# Patient Record
Sex: Male | Born: 2000 | Race: Black or African American | Hispanic: No | Marital: Single | Smoking: Never smoker
Health system: Southern US, Community
[De-identification: ages and names within clinical notes are randomized; demographics above are authoritative.]

## PROBLEM LIST (undated history)

## (undated) DIAGNOSIS — J45909 Unspecified asthma, uncomplicated: Secondary | ICD-10-CM

---
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
## 2020-04-29 IMAGING — CR DG CHEST 2V
2 series · 2 of 2 positions shown · non-contrast
Comparison: Chest x-ray 03/11/2018.

CLINICAL DATA: Left-sided chest pain.  Shortness of breath.

EXAM:
CHEST - 2 VIEW

[chest pa]
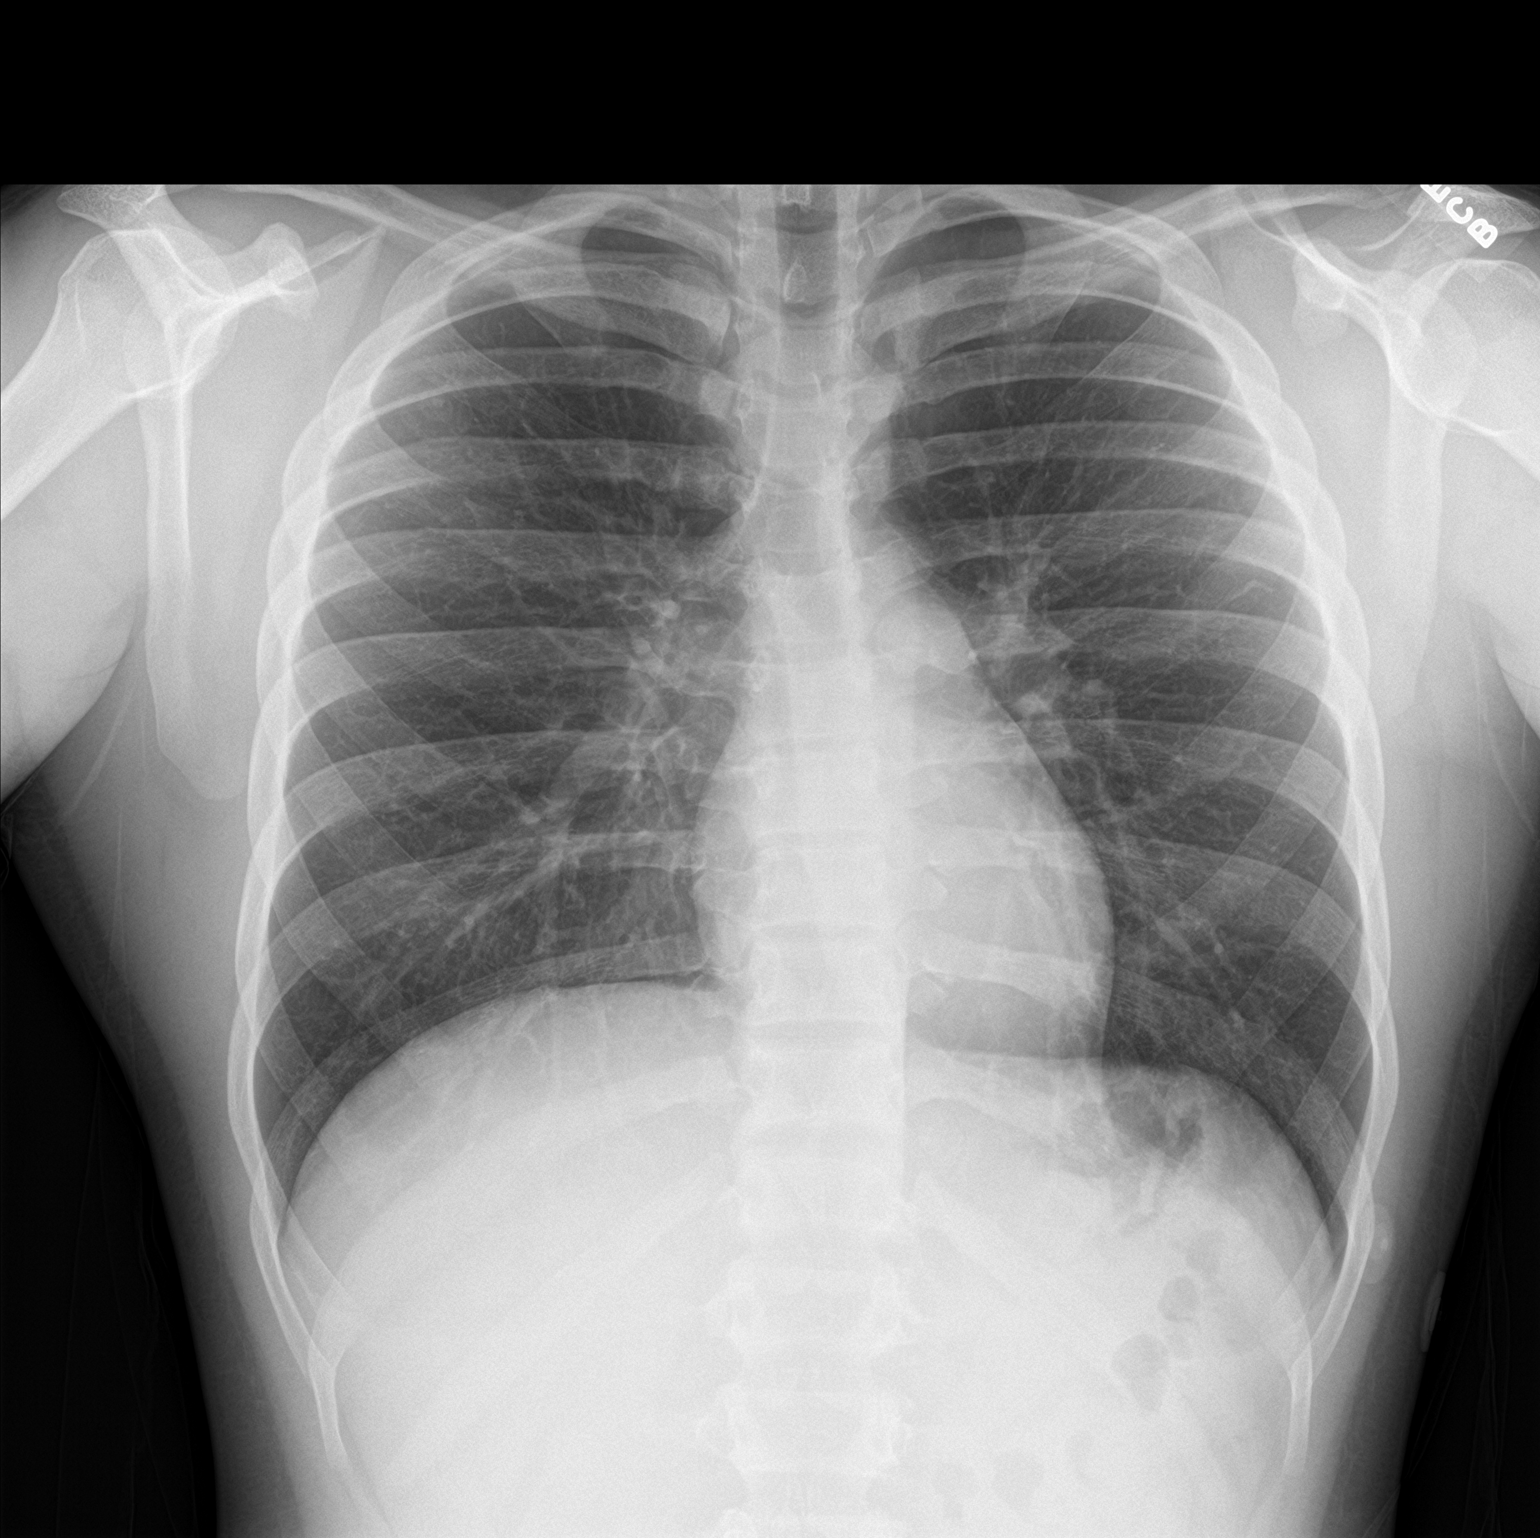

[chest lat]
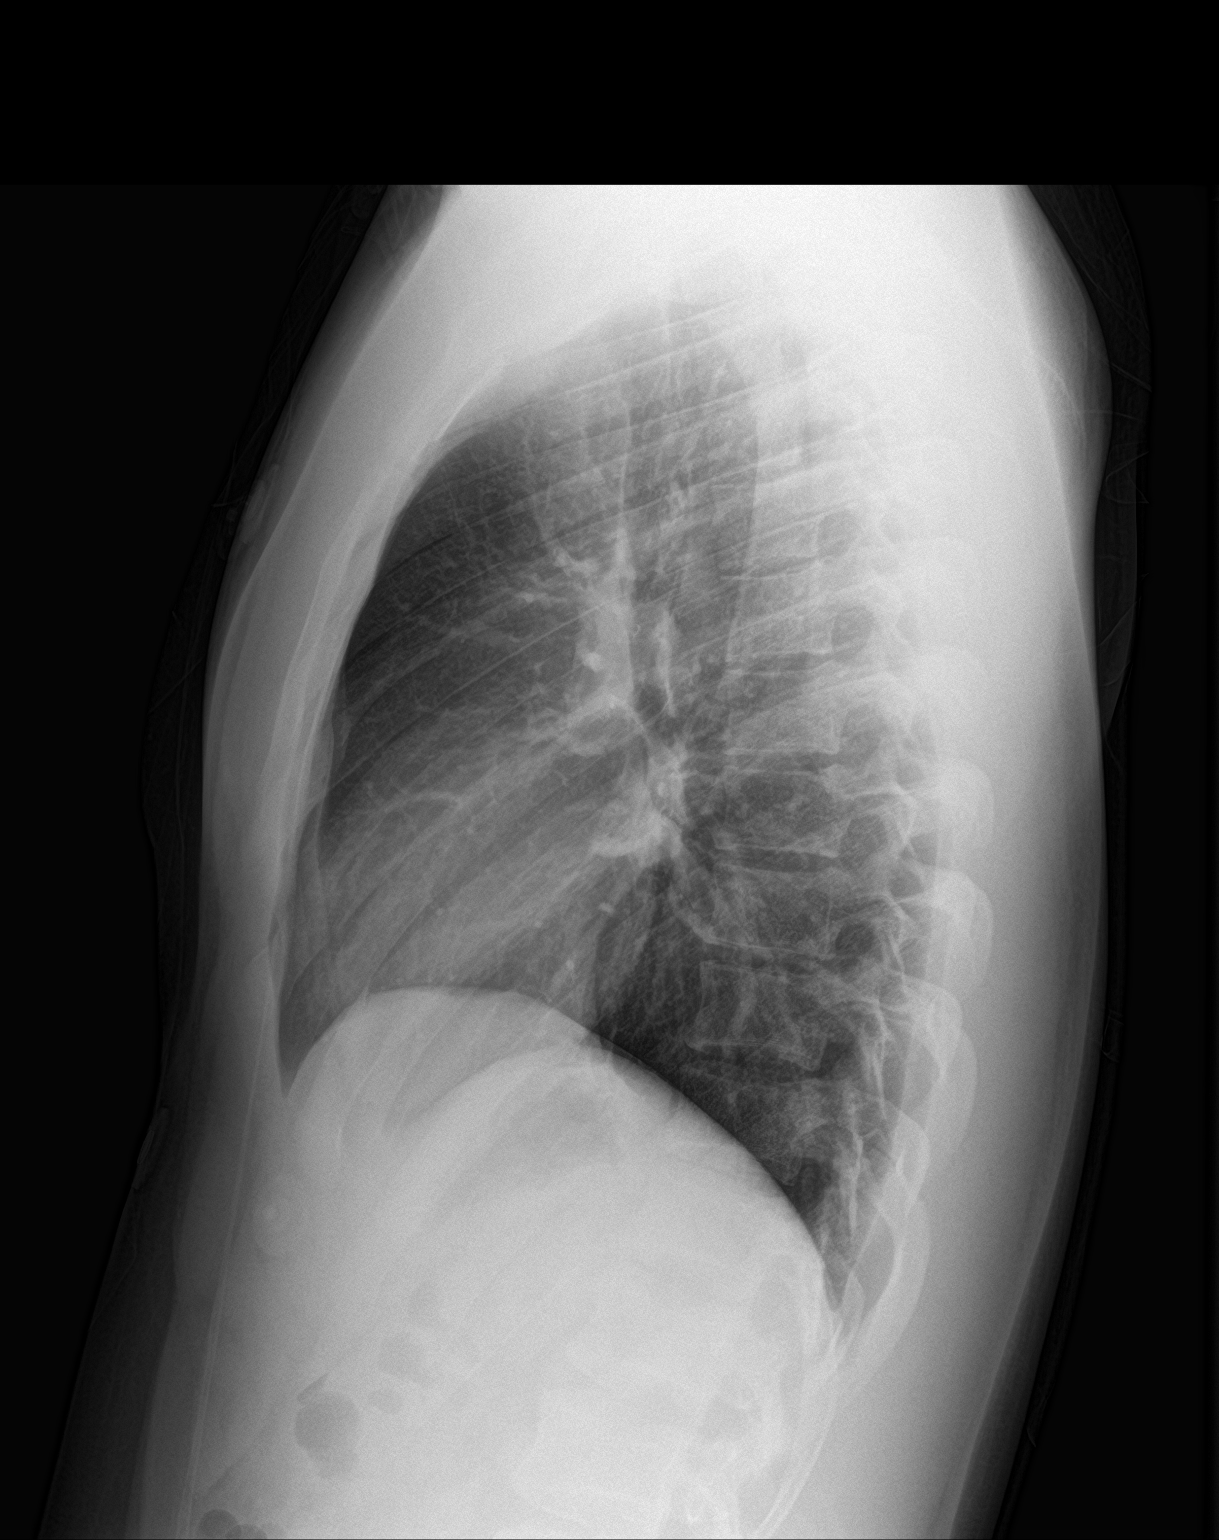

[2 of 2 positions shown; findings below may reference images not displayed]

FINDINGS: Mediastinum and hilar structures normal. Lungs are clear. No pleural
effusion or pneumothorax. Chest is stable from prior exam. No acute
bony abnormality.
IMPRESSION: No acute cardiopulmonary disease.

## 2020-06-12 ENCOUNTER — Emergency Department (HOSPITAL_COMMUNITY)
Admission: EM | Admit: 2020-06-12 | Discharge: 2020-06-12 | Disposition: A | Discharge status: Home / Self Care | Payer: No Typology Code available for payment source | Attending: Emergency Medicine | Admitting: Emergency Medicine

## 2020-06-12 ENCOUNTER — Emergency Department (HOSPITAL_COMMUNITY): Payer: No Typology Code available for payment source

## 2020-06-12 ENCOUNTER — Other Ambulatory Visit: Payer: Self-pay

## 2020-06-12 ENCOUNTER — Encounter (HOSPITAL_COMMUNITY): Payer: Self-pay | Admitting: Emergency Medicine

## 2020-06-12 DIAGNOSIS — Z7951 Long term (current) use of inhaled steroids: Secondary | ICD-10-CM | POA: Insufficient documentation

## 2020-06-12 DIAGNOSIS — M25562 Pain in left knee: Secondary | ICD-10-CM | POA: Diagnosis not present

## 2020-06-12 DIAGNOSIS — M546 Pain in thoracic spine: Secondary | ICD-10-CM | POA: Diagnosis not present

## 2020-06-12 DIAGNOSIS — R519 Headache, unspecified: Secondary | ICD-10-CM | POA: Insufficient documentation

## 2020-06-12 DIAGNOSIS — M542 Cervicalgia: Secondary | ICD-10-CM | POA: Diagnosis not present

## 2020-06-12 DIAGNOSIS — J45909 Unspecified asthma, uncomplicated: Secondary | ICD-10-CM | POA: Insufficient documentation

## 2020-06-12 DIAGNOSIS — S3992XA Unspecified injury of lower back, initial encounter: Secondary | ICD-10-CM

## 2020-06-12 DIAGNOSIS — M25561 Pain in right knee: Secondary | ICD-10-CM | POA: Diagnosis not present

## 2020-06-12 DIAGNOSIS — Y92410 Unspecified street and highway as the place of occurrence of the external cause: Secondary | ICD-10-CM | POA: Diagnosis not present

## 2020-06-12 DIAGNOSIS — S161XXA Strain of muscle, fascia and tendon at neck level, initial encounter: Secondary | ICD-10-CM

## 2020-06-12 MED ORDER — OXYCODONE-ACETAMINOPHEN 5-325 MG PO TABS
1.0000 | ORAL_TABLET | Freq: Once | ORAL | Status: AC
  Administered 2020-06-12: 1 via ORAL
  Filled 2020-06-12: qty 1

## 2020-06-12 NOTE — Discharge Instructions (Addendum)
You will likely be sore for a few days.  Use ice on the sore spots 3-4 times a day, after that use heat.  For pain use Motrin or Tylenol.
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
## 2020-12-01 ENCOUNTER — Ambulatory Visit
Admission: EM | Admit: 2020-12-01 | Discharge: 2020-12-01 | Disposition: A | Discharge status: Home / Self Care | Payer: BC Managed Care – PPO | Attending: Emergency Medicine | Admitting: Emergency Medicine

## 2020-12-01 ENCOUNTER — Other Ambulatory Visit: Payer: Self-pay

## 2020-12-01 DIAGNOSIS — S76011A Strain of muscle, fascia and tendon of right hip, initial encounter: Secondary | ICD-10-CM

## 2020-12-01 DIAGNOSIS — S79911A Unspecified injury of right hip, initial encounter: Secondary | ICD-10-CM

## 2020-12-01 MED ORDER — IBUPROFEN 800 MG PO TABS: 800.0000 mg | ORAL_TABLET | Freq: Three times a day (TID) | ORAL | 0 refills | Status: DC

## 2020-12-01 NOTE — ED Triage Notes (Signed)
Pt presents with ongoing right sided pelvic pain x 2 days since playing basket ball yesterday. Pt reports self medicating with marijuana at home. Ambulatory. Reports hering a pop in his pelvis yesterday while playing.

## 2020-12-01 NOTE — ED Provider Notes (Signed)
[facility]    CSN: [id number] Arrival date & time: 12/01/20  1238      History   Chief Complaint Chief Complaint  Patient presents with   Pelvic Pain    HPI Logan Tate is a 20 y.o. male presenting today for evaluation of hip pain.  Patient reports that yesterday he was playing basketball, planted and pivoted and felt a popping sensation in his right hip.  Since he has had increased pain, sensation of weakness in his leg as well as pain with most movements requiring hip flexion.  Denies history of hip problems.  Using marijuana to help with pain.  HPI  Past Medical History:  Diagnosis Date   Asthma    as a child    There are no problems to display for this patient.   History reviewed. No pertinent surgical history.     Home Medications    Prior to Admission medications   Medication Sig Start Date End Date Taking? Authorizing Provider  ibuprofen (ADVIL) 800 MG tablet Take 1 tablet (800 mg total) by mouth 3 (three) times daily. 12/01/20  Yes Kanaan Kagawa, Junius Creamer, PA-C    Family History Family History  Problem Relation Age of Onset   High Cholesterol Mother    Diabetes Mother    Hyperlipidemia Father     Social History Social History   Tobacco Use   Smoking status: Never   Smokeless tobacco: Never  Vaping Use   Vaping Use: Never used  Substance Use Topics   Alcohol use: Yes    Comment: 1-2 x a mth.     Drug use: Yes    Types: Marijuana    Comment: 1-2 x a wk     Allergies   Patient has no known allergies.   Review of Systems Review of Systems  Constitutional:  Negative for fatigue and fever.  Eyes:  Negative for redness, itching and visual disturbance.  Respiratory:  Negative for shortness of breath.   Cardiovascular:  Negative for chest pain and leg swelling.  Gastrointestinal:  Negative for nausea and vomiting.  Musculoskeletal:  Positive for arthralgias. Negative for myalgias.  Skin:  Negative for color change, rash and wound.   Neurological:  Negative for dizziness, syncope, weakness, light-headedness and headaches.    Physical Exam Triage Vital Signs ED Triage Vitals  Enc Vitals Group     BP 12/01/20 1346 128/85     Pulse Rate 12/01/20 1346 90     Resp 12/01/20 1346 19     Temp 12/01/20 1346 98.2 F (36.8 C)     Temp src --      SpO2 12/01/20 1346 100 %     Weight --      Height --      Head Circumference --      Peak Flow --      Pain Score 12/01/20 1347 7     Pain Loc --      Pain Edu? --      Excl. in GC? --    No data found.  Updated Vital Signs BP 128/85   Pulse 90   Temp 98.2 F (36.8 C)   Resp 19   SpO2 100%   Visual Acuity Right Eye Distance:   Left Eye Distance:   Bilateral Distance:    Right Eye Near:   Left Eye Near:    Bilateral Near:     Physical Exam Vitals and nursing note reviewed.  Constitutional:  Appearance: He is well-developed.     Comments: No acute distress  HENT:     Head: Normocephalic and atraumatic.     Nose: Nose normal.  Eyes:     Conjunctiva/sclera: Conjunctivae normal.  Cardiovascular:     Rate and Rhythm: Normal rate.  Pulmonary:     Effort: Pulmonary effort is normal. No respiratory distress.  Abdominal:     General: There is no distension.  Musculoskeletal:        General: Normal range of motion.     Cervical back: Neck supple.     Comments: Right hip: Diffuse tenderness along bony prominences of the hip posteriorly laterally more prominent anteriorly and in right groin, no palpable bulges or irregularities, tenderness in the proximal hip, pain with hip flexion, but full active range of motion  Skin:    General: Skin is warm and dry.  Neurological:     Mental Status: He is alert and oriented to person, place, and time.     UC Treatments / Results  Labs (all labs ordered are listed, but only abnormal results are displayed) Labs Reviewed - No data to display  EKG   Radiology No results found.  Procedures Procedures  (including critical care time)  Medications Ordered in UC Medications - No data to display  Initial Impression / Assessment and Plan / UC Course  I have reviewed the triage vital signs and the nursing notes.  Pertinent labs & imaging results that were available during my care of the patient were reviewed by me and considered in my medical decision making (see chart for details).     Suspect likely right strain of proximal quad or hip flexor strain, given injury will obtain x-ray to rule out bony abnormality or avulsion fracture off hip indicating tendon rupture.  Recommending anti-inflammatories ice gentle stretching and monitor for gradual resolution, follow-up with sports medicine if not improving.  Discussed strict return precautions. Patient verbalized understanding and is agreeable with plan.  Final Clinical Impressions(s) / UC Diagnoses   Final diagnoses:  Strain of flexor muscle of right hip, initial encounter  Hip injury, right, initial encounter     Discharge Instructions      Please go for x-rays-I will call you with results Tylenol and ibuprofen for pain/swelling Apply ice Gentle stretching of quadriceps/hip area Rest Follow-up with sports medicine if not improving     ED Prescriptions     Medication Sig Dispense Auth. Provider   ibuprofen (ADVIL) 800 MG tablet Take 1 tablet (800 mg total) by mouth 3 (three) times daily. 21 tablet Dillyn Menna, [place] C, PA-C      PDMP not reviewed this encounter.   Lew Dawes, [place] 12/01/20 1503

## 2020-12-01 NOTE — Discharge Instructions (Addendum)
Please go for x-rays-I will call you with results Tylenol and ibuprofen for pain/swelling Apply ice Gentle stretching of quadriceps/hip area Rest Follow-up with sports medicine if not improving

## 2020-12-06 ENCOUNTER — Ambulatory Visit: Payer: Self-pay | Admitting: Family Medicine

## 2020-12-07 ENCOUNTER — Ambulatory Visit
Admission: EM | Admit: 2020-12-07 | Discharge: 2020-12-07 | Disposition: A | Discharge status: Home / Self Care | Payer: Self-pay | Attending: Student | Admitting: Student

## 2020-12-07 ENCOUNTER — Other Ambulatory Visit: Payer: Self-pay

## 2020-12-07 ENCOUNTER — Encounter: Payer: Self-pay | Admitting: Emergency Medicine

## 2020-12-07 DIAGNOSIS — L03031 Cellulitis of right toe: Secondary | ICD-10-CM

## 2020-12-07 MED ORDER — DOXYCYCLINE HYCLATE 100 MG PO CAPS: 100.0000 mg | ORAL_CAPSULE | Freq: Two times a day (BID) | ORAL | 0 refills | Status: AC

## 2020-12-07 NOTE — ED Triage Notes (Signed)
Pt sts right great toe pain after playing basketball 2 days ago; denies obvious injury

## 2020-12-07 NOTE — ED Provider Notes (Signed)
[facility]    CSN: [id number] Arrival date & time: 12/07/20  1708      History   Chief Complaint Chief Complaint  Patient presents with   Toe Pain    HPI Logan Tate is a 20 y.o. male presenting with R great toe pain x2 days following playing basketball. Denies known injury. Medical history noncontributory. Has been soaking the toe in alcohol with minimal improvement. Denies discharge.  HPI  Past Medical History:  Diagnosis Date   Asthma    as a child    There are no problems to display for this patient.   History reviewed. No pertinent surgical history.     Home Medications    Prior to Admission medications   Medication Sig Start Date End Date Taking? Authorizing Provider  doxycycline (VIBRAMYCIN) 100 MG capsule Take 1 capsule (100 mg total) by mouth 2 (two) times daily for 7 days. 12/07/20 12/14/20 Yes Rhys Martini, PA-C  ibuprofen (ADVIL) 800 MG tablet Take 1 tablet (800 mg total) by mouth 3 (three) times daily. 12/01/20   Wieters, Junius Creamer, PA-C    Family History Family History  Problem Relation Age of Onset   High Cholesterol Mother    Diabetes Mother    Hyperlipidemia Father     Social History Social History   Tobacco Use   Smoking status: Never   Smokeless tobacco: Never  Vaping Use   Vaping Use: Never used  Substance Use Topics   Alcohol use: Yes    Comment: 1-2 x a mth.     Drug use: Yes    Types: Marijuana    Comment: 1-2 x a wk     Allergies   Patient has no known allergies.   Review of Systems Review of Systems  Skin:        R great toe infection  All other systems reviewed and are negative.   Physical Exam Triage Vital Signs ED Triage Vitals [12/07/20 1735]  Enc Vitals Group     BP 115/70     Pulse Rate 61     Resp 18     Temp 98.6 F (37 C)     Temp Source Oral     SpO2 97 %     Weight      Height      Head Circumference      Peak Flow      Pain Score 5     Pain Loc      Pain Edu?      Excl. in  GC?    No data found.  Updated Vital Signs BP 115/70 (BP Location: Left Arm)   Pulse 61   Temp 98.6 F (37 C) (Oral)   Resp 18   SpO2 97%   Visual Acuity Right Eye Distance:   Left Eye Distance:   Bilateral Distance:    Right Eye Near:   Left Eye Near:    Bilateral Near:     Physical Exam Vitals reviewed.  Constitutional:      General: He is not in acute distress.    Appearance: Normal appearance. He is not ill-appearing or diaphoretic.  HENT:     Head: Normocephalic and atraumatic.  Cardiovascular:     Rate and Rhythm: Normal rate and regular rhythm.     Heart sounds: Normal heart sounds.  Pulmonary:     Effort: Pulmonary effort is normal.     Breath sounds: Normal breath sounds.  Skin:  General: Skin is warm.     Comments: See image below R great toe with erythema and induration to lateral nail fold. TTP. Minimal warmth. No fluctuance or discharge. Cap refill <2 seconds. ROM toes intact and without pain. DP 2+. No midfoot or medial/lateral malleolar tenderness.   Neurological:     General: No focal deficit present.     Mental Status: He is alert and oriented to person, place, and time.  Psychiatric:        Mood and Affect: Mood normal.        Behavior: Behavior normal.        Thought Content: Thought content normal.        Judgment: Judgment normal.       UC Treatments / Results  Labs (all labs ordered are listed, but only abnormal results are displayed) Labs Reviewed - No data to display  EKG   Radiology No results found.  Procedures Procedures (including critical care time)  Medications Ordered in UC Medications - No data to display  Initial Impression / Assessment and Plan / UC Course  I have reviewed the triage vital signs and the nursing notes.  Pertinent labs & imaging results that were available during my care of the patient were reviewed by me and considered in my medical decision making (see chart for details).     This patient  is a very pleasant 20 y.o. year old male presenting with paronychia R great toe related to footwear. Afebrile, nontachy, neurovascularly intact. Doxycycline. Wear open-toed shoes. Wound care discussed. ED return precautions discussed. Patient verbalizes understanding and agreement.   Final Clinical Impressions(s) / UC Diagnoses   Final diagnoses:  Paronychia of great toe of right foot     Discharge Instructions      -Doxycycline twice daily for 7 days.  Make sure to wear sunscreen while spending time outside while on this medication as it can increase your chance of sunburn. You can take this medication with food if you have a sensitive stomach. -Wash your wound with gentle soap and water 1-2 times daily.  Let air dry or gently pat. Avoid hydrogen peroxide or alcohol. You can try soaking the foot in warm water to help pus come out. You can use neosporin ointment or similar and wrap with bandage for protection. -Ibuprofen/Tylenol for discomfort -Seek additional medical attention if symptoms get worse instead of better, like increasing toe swelling, new fever/chills, etc.     ED Prescriptions     Medication Sig Dispense Auth. Provider   doxycycline (VIBRAMYCIN) 100 MG capsule Take 1 capsule (100 mg total) by mouth 2 (two) times daily for 7 days. 14 capsule Rhys Martini, PA-C      PDMP not reviewed this encounter.   Rhys Martini, PA-C 12/07/20 1749

## 2020-12-07 NOTE — Discharge Instructions (Addendum)
-  Doxycycline twice daily for 7 days.  Make sure to wear sunscreen while spending time outside while on this medication as it can increase your chance of sunburn. You can take this medication with food if you have a sensitive stomach. -Wash your wound with gentle soap and water 1-2 times daily.  Let air dry or gently pat. Avoid hydrogen peroxide or alcohol. You can try soaking the foot in warm water to help pus come out. You can use neosporin ointment or similar and wrap with bandage for protection. -Ibuprofen/Tylenol for discomfort -Seek additional medical attention if symptoms get worse instead of better, like increasing toe swelling, new fever/chills, etc.

## 2020-12-08 ENCOUNTER — Other Ambulatory Visit: Payer: Self-pay

## 2020-12-08 ENCOUNTER — Ambulatory Visit (HOSPITAL_COMMUNITY)
Admission: EM | Admit: 2020-12-08 | Discharge: 2020-12-08 | Disposition: A | Discharge status: Home / Self Care | Payer: BC Managed Care – PPO | Attending: Emergency Medicine | Admitting: Emergency Medicine

## 2020-12-08 ENCOUNTER — Encounter (HOSPITAL_COMMUNITY): Payer: Self-pay | Admitting: Emergency Medicine

## 2020-12-08 ENCOUNTER — Ambulatory Visit (INDEPENDENT_AMBULATORY_CARE_PROVIDER_SITE_OTHER): Payer: BC Managed Care – PPO

## 2020-12-08 DIAGNOSIS — S76011A Strain of muscle, fascia and tendon of right hip, initial encounter: Secondary | ICD-10-CM

## 2020-12-08 DIAGNOSIS — M25551 Pain in right hip: Secondary | ICD-10-CM | POA: Diagnosis not present

## 2020-12-08 DIAGNOSIS — M25559 Pain in unspecified hip: Secondary | ICD-10-CM

## 2020-12-08 MED ORDER — IBUPROFEN 800 MG PO TABS: 800.0000 mg | ORAL_TABLET | Freq: Three times a day (TID) | ORAL | 0 refills | Status: DC

## 2020-12-08 NOTE — ED Provider Notes (Signed)
[facility]    CSN: [id number] Arrival date & time: 12/08/20  1608      History   Chief Complaint Chief Complaint  Patient presents with   Hip Pain    HPI Logan Tate is a 20 y.o. male.   Patient here for evaluation of right hip and groin pain that started approximately 1 week ago.  Reports being evaluated for same at the [facility] on [address] and was told to get xrays completed but he did not go.  Reports playing basketball again yesterday and fell on right hip and "felt a pop."  Reports pain is now excruciating.  Since he has had increased pain, sensation of weakness in his leg as well as pain with most movements requiring hip flexion.  Denies history of hip problems.  Denies any fevers, chest pain, shortness of breath, N/V/D, numbness, tingling, weakness, abdominal pain, or headaches.     The history is provided by the patient.  Hip Pain   Past Medical History:  Diagnosis Date   Asthma    as a child    There are no problems to display for this patient.   History reviewed. No pertinent surgical history.     Home Medications    Prior to Admission medications   Medication Sig Start Date End Date Taking? Authorizing Provider  doxycycline (VIBRAMYCIN) 100 MG capsule Take 1 capsule (100 mg total) by mouth 2 (two) times daily for 7 days. 12/07/20 12/14/20 Yes Rhys Martini, PA-C  ibuprofen (ADVIL) 800 MG tablet Take 1 tablet (800 mg total) by mouth 3 (three) times daily. 12/08/20  Yes Ivette Loyal, NP    Family History Family History  Problem Relation Age of Onset   High Cholesterol Mother    Diabetes Mother    Hyperlipidemia Father     Social History Social History   Tobacco Use   Smoking status: Never   Smokeless tobacco: Never  Vaping Use   Vaping Use: Never used  Substance Use Topics   Alcohol use: Yes    Comment: 1-2 x a mth.     Drug use: Yes    Types: Marijuana    Comment: 1-2 x a wk     Allergies   Patient has no known  allergies.   Review of Systems Review of Systems  Musculoskeletal:  Positive for arthralgias and joint swelling.  All other systems reviewed and are negative.   Physical Exam Triage Vital Signs ED Triage Vitals  Enc Vitals Group     BP 12/08/20 1626 135/87     Pulse Rate 12/08/20 1626 92     Resp 12/08/20 1626 18     Temp 12/08/20 1626 98.7 F (37.1 C)     Temp Source 12/08/20 1626 Oral     SpO2 12/08/20 1626 100 %     Weight --      Height --      Head Circumference --      Peak Flow --      Pain Score 12/08/20 1622 8     Pain Loc --      Pain Edu? --      Excl. in GC? --    No data found.  Updated Vital Signs BP 135/87 (BP Location: Right Arm)   Pulse 92   Temp 98.7 F (37.1 C) (Oral)   Resp 18   SpO2 100%   Visual Acuity Right Eye Distance:   Left Eye Distance:   Bilateral Distance:  Right Eye Near:   Left Eye Near:    Bilateral Near:     Physical Exam Vitals and nursing note reviewed.  Constitutional:      General: He is not in acute distress.    Appearance: Normal appearance. He is not ill-appearing, toxic-appearing or diaphoretic.  HENT:     Head: Normocephalic and atraumatic.  Eyes:     Conjunctiva/sclera: Conjunctivae normal.  Cardiovascular:     Rate and Rhythm: Normal rate.     Pulses: Normal pulses.  Pulmonary:     Effort: Pulmonary effort is normal.  Abdominal:     General: Abdomen is flat.  Musculoskeletal:     Cervical back: Normal and normal range of motion.     Thoracic back: Normal.     Right hip: Tenderness (Diffuse tenderness along bony prominences of the hip posteriorly laterally more prominent anteriorly and in right groin, no palpable bulges or irregularities, tenderness in the proximal hip, pain with hip flexion) present. Decreased range of motion (due to pain).  Skin:    General: Skin is warm and dry.  Neurological:     General: No focal deficit present.     Mental Status: He is alert and oriented to person, place, and  time.  Psychiatric:        Mood and Affect: Mood normal.     UC Treatments / Results  Labs (all labs ordered are listed, but only abnormal results are displayed) Labs Reviewed - No data to display  EKG   Radiology DG Hip Unilat With Pelvis 2-3 Views Right  Result Date: 12/08/2020 CLINICAL DATA:  Right hip pain. EXAM: DG HIP (WITH OR WITHOUT PELVIS) 2-3V RIGHT COMPARISON:  03/09/2011 FINDINGS: There is no evidence of hip fracture or dislocation. There is no evidence of arthropathy or other focal bone abnormality. IMPRESSION: Negative. Electronically Signed   By: Marlan Palau M.D.   On: 12/08/2020 18:04    Procedures Procedures (including critical care time)  Medications Ordered in UC Medications - No data to display  Initial Impression / Assessment and Plan / UC Course  I have reviewed the triage vital signs and the nursing notes.  Pertinent labs & imaging results that were available during my care of the patient were reviewed by me and considered in my medical decision making (see chart for details).    Assessment negative for red flags or concerns.  Xray negative for any acute bony abnormality.  Likely strain of the right hip flexor muscle.  Recommend Ibuprofen and stretching as previously prescribed.  Reports that he did not pick up the ibuprofen so a refill sent to a new pharmacy.  Encouraged patient to rest for a few days.  Follow up as needed.  Final Clinical Impressions(s) / UC Diagnoses   Final diagnoses:  Hip pain  Strain of flexor muscle of hip, right, initial encounter     Discharge Instructions      Take the Ibuprofen as needed for pain.  You can also take Tylenol as needed.   Rest and do the gentle stretching and exercises as needed for comfort.  Ice for 10-15 minutes every 4-6 hours as needed for comfort.    Return or go to the Emergency Department if symptoms worsen or do not improve in the next few days.      ED Prescriptions     Medication Sig  Dispense Auth. Provider   ibuprofen (ADVIL) 800 MG tablet Take 1 tablet (800 mg total) by mouth 3 (three)  times daily. 21 tablet Ivette Loyal, NP      PDMP not reviewed this encounter.   Ivette Loyal, NP 12/08/20 1816

## 2020-12-08 NOTE — ED Triage Notes (Signed)
Right hip, groin pain that started after playing basketball .  Injury occurred on week ago.  Patient thinks he re injured right hip/groin on Monday.

## 2020-12-08 NOTE — Discharge Instructions (Addendum)
Take the Ibuprofen as needed for pain.  You can also take Tylenol as needed.   Rest and do the gentle stretching and exercises as needed for comfort.  Ice for 10-15 minutes every 4-6 hours as needed for comfort.    Return or go to the Emergency Department if symptoms worsen or do not improve in the next few days.

## 2020-12-15 ENCOUNTER — Ambulatory Visit
Admission: EM | Admit: 2020-12-15 | Discharge: 2020-12-15 | Disposition: A | Discharge status: Home / Self Care | Payer: BC Managed Care – PPO

## 2020-12-15 ENCOUNTER — Other Ambulatory Visit: Payer: Self-pay

## 2020-12-15 DIAGNOSIS — G8929 Other chronic pain: Secondary | ICD-10-CM | POA: Diagnosis not present

## 2020-12-15 DIAGNOSIS — M545 Low back pain, unspecified: Secondary | ICD-10-CM | POA: Diagnosis not present

## 2020-12-15 NOTE — ED Triage Notes (Addendum)
Patient presents to Urgent Care with complaints of lower back pain. Has a hx of chronic back pain that has worsened over the past several days. No changes in activity or injury. He reports not taking anything for pain.

## 2020-12-15 NOTE — Discharge Instructions (Addendum)
Follow up with an orthopedist such as the one listed below.

## 2020-12-15 NOTE — ED Provider Notes (Signed)
Logan Tate    CSN: [id number] Arrival date & time: 12/15/20  1820      History   Chief Complaint Chief Complaint  Patient presents with   Back Pain     HPI Logan Tate is a 20 y.o. male.  Patient presents with right lower back pain intermittently for 10 years.  No falls or injury.  He states he needs a note for work today.  He denies numbness, weakness, paresthesias, saddle anesthesia, loss of bowel/bladder control, abdominal pain, dysuria, or other symptoms.  No treatments attempted at home.  Patient was seen at [facility] urgent care on 12/01/2020; diagnosed with pain in his right hip; treated with ibuprofen.  He was seen at [facility] urgent care on 12/07/2020; diagnosed with paronychia of right toe; treated with doxycycline.  He was seen at [facility] urgent care on 12/08/2020; diagnosed with right hip strain; x-ray negative; treated with ibuprofen.  The history is provided by the patient and medical records.   Past Medical History:  Diagnosis Date   Asthma    as a child    There are no problems to display for this patient.   History reviewed. No pertinent surgical history.     Home Medications    Prior to Admission medications   Medication Sig Start Date End Date Taking? Authorizing Provider  ibuprofen (ADVIL) 800 MG tablet Take 1 tablet (800 mg total) by mouth 3 (three) times daily. 12/08/20   Ivette Loyal, NP    Family History Family History  Problem Relation Age of Onset   High Cholesterol Mother    Diabetes Mother    Hyperlipidemia Father     Social History Social History   Tobacco Use   Smoking status: Never   Smokeless tobacco: Never  Vaping Use   Vaping Use: Never used  Substance Use Topics   Alcohol use: Yes    Comment: 1-2 x a mth.     Drug use: Yes    Types: Marijuana    Comment: 1-2 x a wk     Allergies   Patient has no known allergies.   Review of Systems Review of Systems  Constitutional:  Negative for chills and fever.   Respiratory:  Negative for cough and shortness of breath.   Cardiovascular:  Negative for chest pain and palpitations.  Gastrointestinal:  Negative for abdominal pain and vomiting.  Genitourinary:  Negative for dysuria and hematuria.  Musculoskeletal:  Positive for back pain. Negative for arthralgias, gait problem and joint swelling.  Skin:  Negative for color change and rash.  Neurological:  Negative for weakness and numbness.  All other systems reviewed and are negative.   Physical Exam Triage Vital Signs ED Triage Vitals  Enc Vitals Group     BP      Pulse      Resp      Temp      Temp src      SpO2      Weight      Height      Head Circumference      Peak Flow      Pain Score      Pain Loc      Pain Edu?      Excl. in GC?    No data found.  Updated Vital Signs BP 124/81 (BP Location: Left Arm)   Pulse 76   Temp 97.6 F (36.4 C) (Oral)   Resp 18   SpO2 97%   Visual  Acuity Right Eye Distance:   Left Eye Distance:   Bilateral Distance:    Right Eye Near:   Left Eye Near:    Bilateral Near:     Physical Exam Vitals and nursing note reviewed.  Constitutional:      General: He is not in acute distress.    Appearance: He is well-developed. He is not ill-appearing.  HENT:     Head: Normocephalic and atraumatic.     Mouth/Throat:     Mouth: Mucous membranes are moist.  Eyes:     Conjunctiva/sclera: Conjunctivae normal.  Cardiovascular:     Rate and Rhythm: Normal rate and regular rhythm.     Heart sounds: Normal heart sounds.  Pulmonary:     Effort: Pulmonary effort is normal. No respiratory distress.     Breath sounds: Normal breath sounds.  Abdominal:     General: Bowel sounds are normal.     Palpations: Abdomen is soft.     Tenderness: There is no abdominal tenderness. There is no right CVA tenderness, left CVA tenderness, guarding or rebound.  Musculoskeletal:        General: No swelling, tenderness, deformity or signs of injury. Normal range of  motion.     Cervical back: Neck supple.  Skin:    General: Skin is warm and dry.     Capillary Refill: Capillary refill takes less than 2 seconds.  Neurological:     General: No focal deficit present.     Mental Status: He is alert and oriented to person, place, and time.     Sensory: No sensory deficit.     Motor: No weakness.     Gait: Gait normal.  Psychiatric:        Mood and Affect: Mood normal.        Behavior: Behavior normal.     UC Treatments / Results  Labs (all labs ordered are listed, but only abnormal results are displayed) Labs Reviewed - No data to display  EKG   Radiology No results found.  Procedures Procedures (including critical care time)  Medications Ordered in UC Medications - No data to display  Initial Impression / Assessment and Plan / UC Course  I have reviewed the triage vital signs and the nursing notes.  Pertinent labs & imaging results that were available during my care of the patient were reviewed by me and considered in my medical decision making (see chart for details).  Chronic right lower back pain without sciatica.  Work note provided per patient request.  Instructed him to take Tylenol or ibuprofen as needed for discomfort.  Instructed him to follow-up with an orthopedist.  Education provided on chronic back pain.  Patient agrees to plan of care.   Final Clinical Impressions(s) / UC Diagnoses   Final diagnoses:  Chronic right-sided low back pain without sciatica     Discharge Instructions      Follow up with an orthopedist such as the one listed below.         ED Prescriptions   None    PDMP not reviewed this encounter.   Mickie Bail, NP 12/15/20 1859

## 2020-12-20 ENCOUNTER — Other Ambulatory Visit: Payer: Self-pay

## 2020-12-20 ENCOUNTER — Ambulatory Visit
Admission: EM | Admit: 2020-12-20 | Discharge: 2020-12-20 | Disposition: A | Discharge status: Home / Self Care | Payer: BC Managed Care – PPO | Attending: Family Medicine | Admitting: Family Medicine

## 2020-12-20 DIAGNOSIS — J069 Acute upper respiratory infection, unspecified: Secondary | ICD-10-CM | POA: Diagnosis not present

## 2020-12-20 LAB — POCT URINALYSIS DIP (MANUAL ENTRY)
Bilirubin, UA: NEGATIVE
Blood, UA: NEGATIVE
Glucose, UA: NEGATIVE mg/dL
Ketones, POC UA: NEGATIVE mg/dL
Leukocytes, UA: NEGATIVE
Nitrite, UA: NEGATIVE
Protein Ur, POC: NEGATIVE mg/dL
Spec Grav, UA: 1.01 (ref 1.010–1.025)
Urobilinogen, UA: 0.2 E.U./dL
pH, UA: 7 (ref 5.0–8.0)

## 2020-12-20 MED ORDER — CIPROFLOXACIN HCL 500 MG PO TABS: 500.0000 mg | ORAL_TABLET | Freq: Two times a day (BID) | ORAL | 0 refills | Status: DC

## 2020-12-20 NOTE — ED Provider Notes (Signed)
[facility]    CSN: [id number] Arrival date & time: 12/20/20  1428      History   Chief Complaint Chief Complaint  Patient presents with   Nasal Congestion    HPI Ran Tullis is a 20 y.o. male.  Patient complains of feeling ill, rhinorrhea, and some vague urinary urgency without discharge or drainage.  Other family members have similar symptoms.  HPI  Past Medical History:  Diagnosis Date   Asthma    as a child    There are no problems to display for this patient.   History reviewed. No pertinent surgical history.     Home Medications    Prior to Admission medications   Medication Sig Start Date End Date Taking? Authorizing Provider  ciprofloxacin (CIPRO) 500 MG tablet Take 1 tablet (500 mg total) by mouth every 12 (twelve) hours. 12/20/20  Yes Frederica Kuster, MD  ibuprofen (ADVIL) 800 MG tablet Take 1 tablet (800 mg total) by mouth 3 (three) times daily. 12/08/20   Ivette Loyal, NP    Family History Family History  Problem Relation Age of Onset   High Cholesterol Mother    Diabetes Mother    Hyperlipidemia Father     Social History Social History   Tobacco Use   Smoking status: Never   Smokeless tobacco: Never  Vaping Use   Vaping Use: Never used  Substance Use Topics   Alcohol use: Yes    Comment: 1-2 x a mth.     Drug use: Yes    Types: Marijuana    Comment: 1-2 x a wk     Allergies   Patient has no known allergies.   Review of Systems Review of Systems  HENT:  Positive for congestion and rhinorrhea.   Genitourinary:  Positive for urgency.  All other systems reviewed and are negative.   Physical Exam Triage Vital Signs ED Triage Vitals  Enc Vitals Group     BP 12/20/20 1452 113/74     Pulse Rate 12/20/20 1452 94     Resp 12/20/20 1452 18     Temp 12/20/20 1452 98.5 F (36.9 C)     Temp Source 12/20/20 1452 Oral     SpO2 12/20/20 1452 97 %     Weight --      Height --      Head Circumference --      Peak Flow  --      Pain Score 12/20/20 1451 5     Pain Loc --      Pain Edu? --      Excl. in GC? --    No data found.  Updated Vital Signs BP 113/74 (BP Location: Right Arm)   Pulse 94   Temp 98.5 F (36.9 C) (Oral)   Resp 18   SpO2 97%   Visual Acuity Right Eye Distance:   Left Eye Distance:   Bilateral Distance:    Right Eye Near:   Left Eye Near:    Bilateral Near:     Physical Exam Vitals and nursing note reviewed.  Constitutional:      Appearance: Normal appearance.  HENT:     Mouth/Throat:     Mouth: Mucous membranes are moist.     Pharynx: Oropharynx is clear. Posterior oropharyngeal erythema present.  Cardiovascular:     Rate and Rhythm: Normal rate and regular rhythm.  Pulmonary:     Breath sounds: Normal breath sounds.  Neurological:  General: No focal deficit present.     Mental Status: He is alert and oriented to person, place, and time.     UC Treatments / Results  Labs (all labs ordered are listed, but only abnormal results are displayed) Labs Reviewed  POCT URINALYSIS DIP (MANUAL ENTRY)    EKG   Radiology No results found.  Procedures Procedures (including critical care time)  Medications Ordered in UC Medications - No data to display  Initial Impression / Assessment and Plan / UC Course  I have reviewed the triage vital signs and the nursing notes.  Pertinent labs & imaging results that were available during my care of the patient were reviewed by me and considered in my medical decision making (see chart for details).     Upper respiratory infection.  Urinalysis is negative an effort to try explain urgency Final Clinical Impressions(s) / UC Diagnoses   Final diagnoses:  Acute upper respiratory infection   Discharge Instructions   None    ED Prescriptions     Medication Sig Dispense Auth. Provider   ciprofloxacin (CIPRO) 500 MG tablet Take 1 tablet (500 mg total) by mouth every 12 (twelve) hours. 10 tablet Frederica Kuster,  MD      PDMP not reviewed this encounter.   Frederica Kuster, MD 12/20/20 [phone]

## 2020-12-20 NOTE — ED Triage Notes (Signed)
Pt c/o feeling sick, runny nose, and urinary urgency.

## 2021-01-19 ENCOUNTER — Encounter: Payer: Self-pay | Admitting: Emergency Medicine

## 2021-01-19 ENCOUNTER — Other Ambulatory Visit: Payer: Self-pay

## 2021-01-19 ENCOUNTER — Ambulatory Visit
Admission: EM | Admit: 2021-01-19 | Discharge: 2021-01-19 | Disposition: A | Discharge status: Home / Self Care | Payer: BC Managed Care – PPO | Attending: Urgent Care | Admitting: Urgent Care

## 2021-01-19 DIAGNOSIS — M25512 Pain in left shoulder: Secondary | ICD-10-CM

## 2021-01-19 DIAGNOSIS — M25561 Pain in right knee: Secondary | ICD-10-CM

## 2021-01-19 MED ORDER — NAPROXEN 500 MG PO TABS: 500.0000 mg | ORAL_TABLET | Freq: Two times a day (BID) | ORAL | 0 refills | Status: DC

## 2021-01-19 NOTE — ED Provider Notes (Signed)
[facility]   MRN: [id number] DOB: [birth year]  Subjective:   Logan Tate is a 20 y.o. male presenting for 3-day history of left shoulder injury and bilateral knee pain.  Patient states that he was playing basketball when another player grabbed him by the left arm and he fell backward.  Since then he has had progressively worsening moderate left shoulder pain.  He also does lifting for his work.  Has had chronic knee pain but is worse since he had a fall.  Has had multiple imaging done.  Does not have an orthopedist.  Has not used any medications for pain relief.  No current facility-administered medications for this encounter.  Current Outpatient Medications:    naproxen (NAPROSYN) 500 MG tablet, Take 1 tablet (500 mg total) by mouth 2 (two) times daily with a meal., Disp: 30 tablet, Rfl: 0   ciprofloxacin (CIPRO) 500 MG tablet, Take 1 tablet (500 mg total) by mouth every 12 (twelve) hours. (Patient not taking: Reported on 01/19/2021), Disp: 10 tablet, Rfl: 0   ibuprofen (ADVIL) 800 MG tablet, Take 1 tablet (800 mg total) by mouth 3 (three) times daily., Disp: 21 tablet, Rfl: 0   No Known Allergies  Past Medical History:  Diagnosis Date   Asthma    as a child     History reviewed. No pertinent surgical history.  Family History  Problem Relation Age of Onset   High Cholesterol Mother    Diabetes Mother    Hyperlipidemia Father     Social History   Tobacco Use   Smoking status: Never   Smokeless tobacco: Never  Vaping Use   Vaping Use: Never used  Substance Use Topics   Alcohol use: Yes    Comment: 1-2 x a mth.     Drug use: Yes    Types: Marijuana    Comment: 1-2 x a wk    ROS   Objective:   Vitals: BP 125/69 (BP Location: Left Arm)   Pulse 87   Temp 98.5 F (36.9 C) (Oral)   Resp 18   SpO2 97%   Physical Exam Constitutional:      General: He is not in acute distress.    Appearance: Normal appearance. He is well-developed and normal weight.  He is not ill-appearing, toxic-appearing or diaphoretic.  HENT:     Head: Normocephalic and atraumatic.     Right Ear: External ear normal.     Left Ear: External ear normal.     Nose: Nose normal.     Mouth/Throat:     Pharynx: Oropharynx is clear.  Eyes:     General: No scleral icterus.       Right eye: No discharge.        Left eye: No discharge.     Extraocular Movements: Extraocular movements intact.     Pupils: Pupils are equal, round, and reactive to light.  Cardiovascular:     Rate and Rhythm: Normal rate.  Pulmonary:     Effort: Pulmonary effort is normal.  Musculoskeletal:     Left shoulder: Tenderness (+ Hawkins and Neer test) present. No swelling, deformity, effusion, laceration, bony tenderness or crepitus. Normal range of motion. Normal strength.     Cervical back: Normal range of motion.     Right knee: No swelling, deformity, effusion, erythema, ecchymosis, lacerations, bony tenderness or crepitus. Normal range of motion. No tenderness. Normal alignment and normal patellar mobility.     Left knee: No swelling, deformity, effusion, erythema, ecchymosis,  lacerations, bony tenderness or crepitus. Normal range of motion. No tenderness. Normal alignment and normal patellar mobility.  Skin:    General: Skin is warm and dry.  Neurological:     Mental Status: He is alert and oriented to person, place, and time.  Psychiatric:        Mood and Affect: Mood normal.        Behavior: Behavior normal.        Thought Content: Thought content normal.        Judgment: Judgment normal.    Assessment and Plan :   PDMP not reviewed this encounter.  1. Acute pain of left shoulder   2. Acute pain of both knees     Shoulder exam more consistent with rotator cuff syndrome.  Patient also has acute on chronic knee pain but none was elicited on exam.  Suspect that all the symptoms are related to his recent fall and sport injury.  Recommended naproxen for pain and inflammation.   Discussed shoulder rehab.  Follow-up with orthopedics as soon as possible for consideration of an ultrasound to evaluate the rotator cuff and labrum of the left shoulder more intently.  Counseled that they may want to do this for his knees as well. Counseled patient on potential for adverse effects with medications prescribed/recommended today, ER and return-to-clinic precautions discussed, patient verbalized understanding.    Wallis Bamberg, [place] 01/19/21 [phone]

## 2021-01-19 NOTE — ED Triage Notes (Signed)
Pt here for left shoulder pain after being grabbed and falling while playing basketball 3 days ago; pt sts bilateral knee pain that is chronic in nature and is worse since fall

## 2021-01-21 ENCOUNTER — Ambulatory Visit (HOSPITAL_COMMUNITY)
Admission: EM | Admit: 2021-01-21 | Discharge: 2021-01-21 | Disposition: A | Discharge status: Home / Self Care | Payer: BC Managed Care – PPO | Attending: Family Medicine | Admitting: Family Medicine

## 2021-01-21 ENCOUNTER — Other Ambulatory Visit: Payer: Self-pay

## 2021-01-21 ENCOUNTER — Encounter (HOSPITAL_COMMUNITY): Payer: Self-pay

## 2021-01-21 DIAGNOSIS — M25561 Pain in right knee: Secondary | ICD-10-CM

## 2021-01-21 DIAGNOSIS — G8929 Other chronic pain: Secondary | ICD-10-CM | POA: Diagnosis not present

## 2021-01-21 DIAGNOSIS — M25562 Pain in left knee: Secondary | ICD-10-CM | POA: Diagnosis not present

## 2021-01-21 MED ORDER — PREDNISONE 20 MG PO TABS: 40.0000 mg | ORAL_TABLET | Freq: Every day | ORAL | 0 refills | Status: DC

## 2021-01-21 NOTE — ED Triage Notes (Signed)
Pt presents with knee pain. States he has arthritis in his knees and states the medicine he is taking has not helped him. States the medicine was started 3 days ago.

## 2021-01-21 NOTE — ED Provider Notes (Signed)
[facility]    CSN: [id number] Arrival date & time: 01/21/21  1808      History   Chief Complaint Chief Complaint  Patient presents with   Knee Pain    HPI Logan Tate is a 20 y.o. male.   Patient presenting today with acute on chronic bilateral anterior knee pain.  States it comes and goes, worse with increase of activity.  Played basketball quite a bit recently and states this is made it worse.  Was seen several days ago for a shoulder injury, given naproxen which should help the shoulder but has not been helping the knees as much.  Denies swelling, discoloration, numbness, tingling, traumatic injury.   Past Medical History:  Diagnosis Date   Asthma    as a child    There are no problems to display for this patient.   History reviewed. No pertinent surgical history.     Home Medications    Prior to Admission medications   Medication Sig Start Date End Date Taking? Authorizing Provider  predniSONE (DELTASONE) 20 MG tablet Take 2 tablets (40 mg total) by mouth daily with breakfast. 01/21/21  Yes Particia Nearing, PA-C  ciprofloxacin (CIPRO) 500 MG tablet Take 1 tablet (500 mg total) by mouth every 12 (twelve) hours. Patient not taking: Reported on 01/19/2021 12/20/20   Frederica Kuster, MD  ibuprofen (ADVIL) 800 MG tablet Take 1 tablet (800 mg total) by mouth 3 (three) times daily. 12/08/20   Ivette Loyal, NP  naproxen (NAPROSYN) 500 MG tablet Take 1 tablet (500 mg total) by mouth 2 (two) times daily with a meal. 01/19/21   Wallis Bamberg, PA-C    Family History Family History  Problem Relation Age of Onset   High Cholesterol Mother    Diabetes Mother    Hyperlipidemia Father     Social History Social History   Tobacco Use   Smoking status: Never   Smokeless tobacco: Never  Vaping Use   Vaping Use: Never used  Substance Use Topics   Alcohol use: Yes    Comment: 1-2 x a mth.     Drug use: Yes    Types: Marijuana    Comment: 1-2 x a wk      Allergies   Patient has no known allergies.   Review of Systems Review of Systems Per HPI  Physical Exam Triage Vital Signs ED Triage Vitals  Enc Vitals Group     BP 01/21/21 1909 121/79     Pulse Rate 01/21/21 1909 86     Resp 01/21/21 1909 19     Temp 01/21/21 1909 98.6 F (37 C)     Temp Source 01/21/21 1909 Oral     SpO2 01/21/21 1909 100 %     Weight --      Height --      Head Circumference --      Peak Flow --      Pain Score 01/21/21 1910 7     Pain Loc --      Pain Edu? --      Excl. in GC? --    No data found.  Updated Vital Signs BP 121/79 (BP Location: Right Arm)   Pulse 86   Temp 98.6 F (37 C) (Oral)   Resp 19   SpO2 100%   Visual Acuity Right Eye Distance:   Left Eye Distance:   Bilateral Distance:    Right Eye Near:   Left Eye Near:  Bilateral Near:     Physical Exam Vitals and nursing note reviewed.  Constitutional:      Appearance: Normal appearance.  HENT:     Head: Atraumatic.     Mouth/Throat:     Mouth: Mucous membranes are moist.  Eyes:     Extraocular Movements: Extraocular movements intact.     Conjunctiva/sclera: Conjunctivae normal.  Cardiovascular:     Rate and Rhythm: Normal rate and regular rhythm.  Pulmonary:     Effort: Pulmonary effort is normal.     Breath sounds: Normal breath sounds.  Musculoskeletal:        General: Tenderness present. No swelling or deformity. Normal range of motion.     Cervical back: Normal range of motion and neck supple.     Comments: Bilateral anterior knee pain extending into the insertion of the tibia.  Mild tenderness to palpation in distal quadricep muscles bilaterally.  Skin:    General: Skin is warm and dry.     Findings: No bruising or erythema.  Neurological:     General: No focal deficit present.     Mental Status: He is oriented to person, place, and time.     Motor: No weakness.     Gait: Gait normal.  Psychiatric:        Mood and Affect: Mood normal.         Thought Content: Thought content normal.        Judgment: Judgment normal.     UC Treatments / Results  Labs (all labs ordered are listed, but only abnormal results are displayed) Labs Reviewed - No data to display  EKG   Radiology No results found.  Procedures Procedures (including critical care time)  Medications Ordered in UC Medications - No data to display  Initial Impression / Assessment and Plan / UC Course  I have reviewed the triage vital signs and the nursing notes.  Pertinent labs & imaging results that were available during my care of the patient were reviewed by me and considered in my medical decision making (see chart for details).     Suspect soft tissue inflammation, likely patellofemoral syndrome.  Discussed stretches and rolling for the quadricep muscles, ice off-and-on, prednisone burst.  Has a an appointment with sports medicine coming up and will use this is a follow-up. Final Clinical Impressions(s) / UC Diagnoses   Final diagnoses:  Chronic pain of both knees   Discharge Instructions   None    ED Prescriptions     Medication Sig Dispense Auth. Provider   predniSONE (DELTASONE) 20 MG tablet Take 2 tablets (40 mg total) by mouth daily with breakfast. 10 tablet Particia Nearing, [place]      PDMP not reviewed this encounter.   Particia Nearing, [place] 01/21/21 1954

## 2021-01-24 ENCOUNTER — Ambulatory Visit (HOSPITAL_COMMUNITY)
Admission: EM | Admit: 2021-01-24 | Discharge: 2021-01-24 | Disposition: A | Discharge status: Home / Self Care | Payer: BC Managed Care – PPO | Attending: Internal Medicine | Admitting: Internal Medicine

## 2021-01-24 ENCOUNTER — Other Ambulatory Visit: Payer: Self-pay

## 2021-01-24 ENCOUNTER — Encounter (HOSPITAL_COMMUNITY): Payer: Self-pay | Admitting: *Deleted

## 2021-01-24 DIAGNOSIS — M7631 Iliotibial band syndrome, right leg: Secondary | ICD-10-CM

## 2021-01-24 NOTE — ED Triage Notes (Signed)
Pt reports he woke with pain in RT leg. Ot reports to be pain free at bed timel ast night.

## 2021-01-24 NOTE — ED Provider Notes (Signed)
[facility]    CSN: [id number] Arrival date & time: 01/24/21  1746      History   Chief Complaint Chief Complaint  Patient presents with   Leg Pain    HPI Logan Tate is a 20 y.o. male comes to the urgent care with right thigh pain which started this morning.  Pain is constant, moderate severity and aggravated by movement.  Pain is mainly on the lateral aspect of the right thigh and aggravated by flexing of the right.  No trauma.  No known relieving factors.  Patient has not tried any medications prior to coming to the urgent care.  No radiation of the pain.  No weakness in the right lower extremity.  Patient plays basketball on a regular basis but he has not played basketball in the past couple of days.  HPI  Past Medical History:  Diagnosis Date   Asthma    as a child    There are no problems to display for this patient.   History reviewed. No pertinent surgical history.     Home Medications    Prior to Admission medications   Medication Sig Start Date End Date Taking? Authorizing Provider  ibuprofen (ADVIL) 800 MG tablet Take 1 tablet (800 mg total) by mouth 3 (three) times daily. 12/08/20   Ivette Loyal, NP  naproxen (NAPROSYN) 500 MG tablet Take 1 tablet (500 mg total) by mouth 2 (two) times daily with a meal. 01/19/21   Wallis Bamberg, PA-C  predniSONE (DELTASONE) 20 MG tablet Take 2 tablets (40 mg total) by mouth daily with breakfast. 01/21/21   Particia Nearing, PA-C    Family History Family History  Problem Relation Age of Onset   High Cholesterol Mother    Diabetes Mother    Hyperlipidemia Father     Social History Social History   Tobacco Use   Smoking status: Never   Smokeless tobacco: Never  Vaping Use   Vaping Use: Never used  Substance Use Topics   Alcohol use: Yes    Comment: 1-2 x a mth.     Drug use: Yes    Types: Marijuana    Comment: 1-2 x a wk     Allergies   Patient has no known allergies.   Review of  Systems Review of Systems  Gastrointestinal: Negative.   Musculoskeletal: Negative.  Negative for joint swelling and myalgias.  Neurological: Negative.     Physical Exam Triage Vital Signs ED Triage Vitals  Enc Vitals Group     BP 01/24/21 1809 114/88     Pulse Rate 01/24/21 1809 92     Resp 01/24/21 1809 18     Temp 01/24/21 1809 98.9 F (37.2 C)     Temp src --      SpO2 01/24/21 1809 98 %     Weight --      Height --      Head Circumference --      Peak Flow --      Pain Score 01/24/21 1806 8     Pain Loc --      Pain Edu? --      Excl. in GC? --    No data found.  Updated Vital Signs BP 114/88   Pulse 92   Temp 98.9 F (37.2 C)   Resp 18   SpO2 98%   Visual Acuity Right Eye Distance:   Left Eye Distance:   Bilateral Distance:    Right Eye  Near:   Left Eye Near:    Bilateral Near:     Physical Exam Vitals and nursing note reviewed.  Constitutional:      General: He is not in acute distress.    Appearance: He is not ill-appearing.  Cardiovascular:     Rate and Rhythm: Normal rate and regular rhythm.  Musculoskeletal:        General: Tenderness present. No deformity or signs of injury. Normal range of motion.     Comments: Tenderness on palpation over the right lateral aspect of the thigh.  Full range of motion  Neurological:     Mental Status: He is alert.     UC Treatments / Results  Labs (all labs ordered are listed, but only abnormal results are displayed) Labs Reviewed - No data to display  EKG   Radiology No results found.  Procedures Procedures (including critical care time)  Medications Ordered in UC Medications - No data to display  Initial Impression / Assessment and Plan / UC Course  I have reviewed the triage vital signs and the nursing notes.  Pertinent labs & imaging results that were available during my care of the patient were reviewed by me and considered in my medical decision making (see chart for details).      1.  Iliotibial band tendinitis on the right side: Gentle range of motion exercises NSAID use as needed Patient is advised to stretch before and after basketball games Return to urgent care if symptoms worsen. Final Clinical Impressions(s) / UC Diagnoses   Final diagnoses:  Iliotibial band tendinitis of right side     Discharge Instructions      Gentle range of motion exercises Please stretch before and after a game Icing of the area involved after a game is recommended Refer to some of the rehab exercises in the discharge paperwork. Ibuprofen as needed for pain   ED Prescriptions   None    PDMP not reviewed this encounter.   Merrilee Jansky, MD 01/24/21 [id number]

## 2021-01-24 NOTE — Discharge Instructions (Addendum)
Gentle range of motion exercises Please stretch before and after a game Icing of the area involved after a game is recommended Refer to some of the rehab exercises in the discharge paperwork. Ibuprofen as needed for pain

## 2021-01-28 ENCOUNTER — Ambulatory Visit (INDEPENDENT_AMBULATORY_CARE_PROVIDER_SITE_OTHER): Payer: BC Managed Care – PPO

## 2021-01-28 ENCOUNTER — Ambulatory Visit
Admission: EM | Admit: 2021-01-28 | Discharge: 2021-01-28 | Disposition: A | Discharge status: Home / Self Care | Payer: BC Managed Care – PPO | Attending: Urgent Care | Admitting: Urgent Care

## 2021-01-28 ENCOUNTER — Other Ambulatory Visit: Payer: Self-pay

## 2021-01-28 DIAGNOSIS — S8001XA Contusion of right knee, initial encounter: Secondary | ICD-10-CM

## 2021-01-28 DIAGNOSIS — M25561 Pain in right knee: Secondary | ICD-10-CM

## 2021-01-28 DIAGNOSIS — G8929 Other chronic pain: Secondary | ICD-10-CM

## 2021-01-28 DIAGNOSIS — M25562 Pain in left knee: Secondary | ICD-10-CM

## 2021-01-28 MED ORDER — MELOXICAM 15 MG PO TABS: 15.0000 mg | ORAL_TABLET | Freq: Every day | ORAL | 1 refills | Status: DC

## 2021-01-28 NOTE — ED Triage Notes (Signed)
Pt last seen on 9/12 for the same right knee pain. Pt reports that at the last OV, he was told that he had arthritis and referred to ortho. Pt missed his appt with ortho and was told to come back to UC for a new referral.  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 Walking and squatting aggravates sxs.

## 2021-01-28 NOTE — ED Provider Notes (Signed)
[facility]   MRN: [id number] DOB: [birth year]  Subjective:   Logan Tate is a 20 y.o. male presenting for acute on chronic knee pain.  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  He has since had moderate to severe pain, worse with walking.  He is also very physically active still.  He did set up an appointment for an evaluation with the orthopedist practices recommended at his last office visit but he missed his appointment.  No current facility-administered medications for this encounter.  Current Outpatient Medications:    ibuprofen (ADVIL) 800 MG tablet, Take 1 tablet (800 mg total) by mouth 3 (three) times daily., Disp: 21 tablet, Rfl: 0   naproxen (NAPROSYN) 500 MG tablet, Take 1 tablet (500 mg total) by mouth 2 (two) times daily with a meal., Disp: 30 tablet, Rfl: 0   predniSONE (DELTASONE) 20 MG tablet, Take 2 tablets (40 mg total) by mouth daily with breakfast., Disp: 10 tablet, Rfl: 0   No Known Allergies  Past Medical History:  Diagnosis Date   Asthma    as a child     History reviewed. No pertinent surgical history.  Family History  Problem Relation Age of Onset   High Cholesterol Mother    Diabetes Mother    Hyperlipidemia Father     Social History   Tobacco Use   Smoking status: Never   Smokeless tobacco: Never  Vaping Use   Vaping Use: Never used  Substance Use Topics   Alcohol use: Yes    Comment: 1-2 x a mth.     Drug use: Yes    Types: Marijuana    Comment: 1-2 x a wk    ROS   Objective:   Vitals: BP 118/74 (BP Location: Left Arm)   Pulse (!) 104   Temp 98.3 F (36.8 C) (Oral)   Resp 18   SpO2 97%   Physical Exam Constitutional:      General: He is not in acute distress.    Appearance: Normal appearance. He is well-developed and normal weight. He is not ill-appearing, toxic-appearing or diaphoretic.  HENT:      Head: Normocephalic and atraumatic.     Right Ear: External ear normal.     Left Ear: External ear normal.     Nose: Nose normal.     Mouth/Throat:     Pharynx: Oropharynx is clear.  Eyes:     General: No scleral icterus.       Right eye: No discharge.        Left eye: No discharge.     Extraocular Movements: Extraocular movements intact.     Pupils: Pupils are equal, round, and reactive to light.  Cardiovascular:     Rate and Rhythm: Normal rate.  Pulmonary:     Effort: Pulmonary effort is normal.  Musculoskeletal:     Cervical back: Normal range of motion.     Right knee: No swelling, deformity, effusion, erythema, ecchymosis, lacerations, bony tenderness or crepitus. Normal range of motion. Tenderness (over quadriceps tendon extending to the superior portion of the patella) present. No medial joint line, lateral joint line or patellar tendon tenderness. Normal alignment, normal meniscus and normal patellar mobility.  Skin:    General: Skin is warm and dry.  Neurological:     Mental Status: He is alert and oriented to  person, place, and time.  Psychiatric:        Mood and Affect: Mood normal.        Behavior: Behavior normal.        Thought Content: Thought content normal.        Judgment: Judgment normal.    DG Knee Complete 4 Views Right  Result Date: 01/28/2021 CLINICAL DATA:  Right knee pain, injury EXAM: RIGHT KNEE - COMPLETE 4+ VIEW COMPARISON:  None. FINDINGS: No evidence of fracture, dislocation, or joint effusion. No evidence of arthropathy or other focal bone abnormality. Soft tissues are unremarkable. IMPRESSION: No fracture or dislocation of the right knee. Joint spaces are preserved. Electronically Signed   By: Lauralyn Primes M.D.   On: 01/28/2021 18:15     Assessment and Plan :   PDMP not reviewed this encounter.  1. Acute pain of right knee   2. Contusion of right knee, initial encounter   3. Chronic pain of both knees     Offered to wrap his right knee but  patient refused.  That recommended conservative management for a right knee contusion. Recommended that he try to reschedule with the orthopedist.  Stop naproxen, switch to meloxicam.  Counseled patient on potential for adverse effects with medications prescribed/recommended today, ER and return-to-clinic precautions discussed, patient verbalized understanding.    Wallis Bamberg, [place] 01/28/21 1823

## 2021-01-31 ENCOUNTER — Other Ambulatory Visit: Payer: Self-pay

## 2021-01-31 ENCOUNTER — Encounter: Payer: Self-pay | Admitting: Emergency Medicine

## 2021-01-31 ENCOUNTER — Ambulatory Visit
Admission: EM | Admit: 2021-01-31 | Discharge: 2021-01-31 | Disposition: A | Discharge status: Home / Self Care | Payer: BC Managed Care – PPO | Attending: Emergency Medicine | Admitting: Emergency Medicine

## 2021-01-31 DIAGNOSIS — M25561 Pain in right knee: Secondary | ICD-10-CM

## 2021-01-31 NOTE — ED Triage Notes (Signed)
Right knee pain, missed ortho appt. Needs referral back to ortho

## 2021-01-31 NOTE — Discharge Instructions (Addendum)
Take ibuprofen as needed for discomfort.  Rest and elevate your knee.  Apply ice packs 2-3 times a day for up to 20 minutes each.  Wear the knee sleeve as needed for comfort.    Follow up with an orthopedist.

## 2021-01-31 NOTE — ED Provider Notes (Signed)
Logan Tate    CSN: [id number] Arrival date & time: 01/31/21  1738      History   Chief Complaint Chief Complaint  Patient presents with   Knee Injury    HPI Logan Tate is a 20 y.o. male.  Patient presents with ongoing right knee pain and swelling x3 to 4 weeks.  He denies falls or injury.  He denies numbness, weakness, paresthesias, wounds, redness, bruising, or other symptoms.  He has been seen at urgent care several times for this but has not followed up with orthopedist.  Patient was seen at [facility] urgent care on 01/28/2021 for same symptoms; diagnosed with contusion of right knee, acute pain of right knee, chronic pain of both knees; x-ray negative; treated with meloxicam and instructed to follow-up with orthopedic.  He was seen at [facility] urgent on 01/24/2021 for right thigh pain; treated with symptomatic management.  He was seen at [facility] urgent care on 01/21/2021; diagnosed with chronic pain of both knees; treated with prednisone.  He was seen at [facility] urgent care on 01/19/2021; diagnosed with acute left shoulder pain, acute pain of both knees; treated with naproxen.  The history is provided by the patient and medical records.   Past Medical History:  Diagnosis Date   Asthma    as a child    There are no problems to display for this patient.   History reviewed. No pertinent surgical history.     Home Medications    Prior to Admission medications   Medication Sig Start Date End Date Taking? Authorizing Provider  ibuprofen (ADVIL) 800 MG tablet Take 1 tablet (800 mg total) by mouth 3 (three) times daily. 12/08/20   Ivette Loyal, NP  meloxicam (MOBIC) 15 MG tablet Take 1 tablet (15 mg total) by mouth daily. 01/28/21   Wallis Bamberg, PA-C  predniSONE (DELTASONE) 20 MG tablet Take 2 tablets (40 mg total) by mouth daily with breakfast. 01/21/21   Particia Nearing, PA-C    Family History Family History  Problem Relation Age of Onset   High Cholesterol Mother     Diabetes Mother    Hyperlipidemia Father     Social History Social History   Tobacco Use   Smoking status: Never   Smokeless tobacco: Never  Vaping Use   Vaping Use: Never used  Substance Use Topics   Alcohol use: Yes    Comment: 1-2 x a mth.     Drug use: Yes    Types: Marijuana    Comment: 1-2 x a wk     Allergies   Patient has no known allergies.   Review of Systems Review of Systems  Constitutional:  Negative for chills and fever.  Respiratory:  Negative for cough and shortness of breath.   Cardiovascular:  Negative for chest pain and palpitations.  Musculoskeletal:  Positive for arthralgias and joint swelling.  Skin:  Negative for color change, rash and wound.  Neurological:  Negative for weakness and numbness.  All other systems reviewed and are negative.   Physical Exam Triage Vital Signs ED Triage Vitals [01/31/21 1746]  Enc Vitals Group     BP 124/79     Pulse Rate 99     Resp 18     Temp 98.2 F (36.8 C)     Temp Source Oral     SpO2 97 %     Weight      Height      Head Circumference  Peak Flow      Pain Score      Pain Loc      Pain Edu?      Excl. in GC?    No data found.  Updated Vital Signs BP 124/79 (BP Location: Left Arm)   Pulse 99   Temp 98.2 F (36.8 C) (Oral)   Resp 18   SpO2 97%   Visual Acuity Right Eye Distance:   Left Eye Distance:   Bilateral Distance:    Right Eye Near:   Left Eye Near:    Bilateral Near:     Physical Exam Vitals and nursing note reviewed.  Constitutional:      General: He is not in acute distress.    Appearance: He is well-developed. He is not ill-appearing.  HENT:     Head: Normocephalic and atraumatic.     Mouth/Throat:     Mouth: Mucous membranes are moist.  Eyes:     Conjunctiva/sclera: Conjunctivae normal.  Cardiovascular:     Rate and Rhythm: Normal rate and regular rhythm.     Heart sounds: Normal heart sounds.  Pulmonary:     Effort: Pulmonary effort is normal. No  respiratory distress.     Breath sounds: Normal breath sounds.  Abdominal:     Palpations: Abdomen is soft.     Tenderness: There is no abdominal tenderness.  Musculoskeletal:        General: Swelling and tenderness present. No deformity or signs of injury. Normal range of motion.     Cervical back: Neck supple.       Legs:     Comments: Mild edema and mild tenderness of right knee.  No wounds, erythema, ecchymosis.  Skin:    General: Skin is warm and dry.     Capillary Refill: Capillary refill takes less than 2 seconds.     Findings: No bruising, erythema, lesion or rash.  Neurological:     General: No focal deficit present.     Mental Status: He is alert and oriented to person, place, and time.     Sensory: No sensory deficit.     Motor: No weakness.     Gait: Gait normal.  Psychiatric:        Mood and Affect: Mood normal.        Behavior: Behavior normal.     UC Treatments / Results  Labs (all labs ordered are listed, but only abnormal results are displayed) Labs Reviewed - No data to display  EKG   Radiology No results found.  Procedures Procedures (including critical care time)  Medications Ordered in UC Medications - No data to display  Initial Impression / Assessment and Plan / UC Course  I have reviewed the triage vital signs and the nursing notes.  Pertinent labs & imaging results that were available during my care of the patient were reviewed by me and considered in my medical decision making (see chart for details).  Right knee pain.  Work note provided per patient request.  Treating with ibuprofen, rest, elevation, ice packs, knee sleeve.  Instructed patient to follow-up with an orthopedist soon as possible.  He agrees to plan of care.   Final Clinical Impressions(s) / UC Diagnoses   Final diagnoses:  Acute pain of right knee     Discharge Instructions      Take ibuprofen as needed for discomfort.  Rest and elevate your knee.  Apply ice packs  2-3 times a day for up to 20 minutes  each.  Wear the knee sleeve as needed for comfort.    Follow up with an orthopedist.        ED Prescriptions   None    I have reviewed the PDMP during this encounter.   Mickie Bail, NP 01/31/21 1818

## 2021-07-27 ENCOUNTER — Other Ambulatory Visit: Payer: Self-pay

## 2021-07-27 ENCOUNTER — Encounter (HOSPITAL_COMMUNITY): Payer: Self-pay

## 2021-07-27 ENCOUNTER — Ambulatory Visit (HOSPITAL_COMMUNITY)
Admission: EM | Admit: 2021-07-27 | Discharge: 2021-07-27 | Disposition: A | Discharge status: Home / Self Care | Payer: BC Managed Care – PPO | Attending: Physician Assistant | Admitting: Physician Assistant

## 2021-07-27 DIAGNOSIS — K529 Noninfective gastroenteritis and colitis, unspecified: Secondary | ICD-10-CM

## 2021-07-27 DIAGNOSIS — R112 Nausea with vomiting, unspecified: Secondary | ICD-10-CM | POA: Diagnosis present

## 2021-07-27 LAB — CBC WITH DIFFERENTIAL/PLATELET
Abs Immature Granulocytes: 0.07 10*3/uL (ref 0.00–0.07)
Basophils Absolute: 0 10*3/uL (ref 0.0–0.1)
Basophils Relative: 0 %
Eosinophils Absolute: 0 10*3/uL (ref 0.0–0.5)
Eosinophils Relative: 0 %
HCT: 45.8 % (ref 39.0–52.0)
Hemoglobin: 15 g/dL (ref 13.0–17.0)
Immature Granulocytes: 0 %
Lymphocytes Relative: 2 %
Lymphs Abs: 0.4 10*3/uL — ABNORMAL LOW (ref 0.7–4.0)
MCH: 26.8 pg (ref 26.0–34.0)
MCHC: 32.8 g/dL (ref 30.0–36.0)
MCV: 81.8 fL (ref 80.0–100.0)
Monocytes Absolute: 0.8 10*3/uL (ref 0.1–1.0)
Monocytes Relative: 5 %
Neutro Abs: 15.4 10*3/uL — ABNORMAL HIGH (ref 1.7–7.7)
Neutrophils Relative %: 93 %
Platelets: 448 10*3/uL — ABNORMAL HIGH (ref 150–400)
RBC: 5.6 MIL/uL (ref 4.22–5.81)
RDW: 12.4 % (ref 11.5–15.5)
WBC: 16.8 10*3/uL — ABNORMAL HIGH (ref 4.0–10.5)
nRBC: 0 % (ref 0.0–0.2)

## 2021-07-27 LAB — COMPREHENSIVE METABOLIC PANEL
ALT: 40 U/L (ref 0–44)
AST: 26 U/L (ref 15–41)
Albumin: 4.3 g/dL (ref 3.5–5.0)
Alkaline Phosphatase: 109 U/L (ref 38–126)
Anion gap: 12 (ref 5–15)
BUN: 10 mg/dL (ref 6–20)
CO2: 22 mmol/L (ref 22–32)
Calcium: 9.5 mg/dL (ref 8.9–10.3)
Chloride: 104 mmol/L (ref 98–111)
Creatinine, Ser: 1.08 mg/dL (ref 0.61–1.24)
GFR, Estimated: >60 mL/min (ref >60)
Glucose, Bld: 161 mg/dL — ABNORMAL HIGH (ref 70–99)
Potassium: 4 mmol/L (ref 3.5–5.1)
Sodium: 138 mmol/L (ref 135–145)
Total Bilirubin: 0.6 mg/dL (ref 0.3–1.2)
Total Protein: 8.3 g/dL — ABNORMAL HIGH (ref 6.5–8.1)

## 2021-07-27 LAB — POCT URINALYSIS DIPSTICK, ED / UC
Bilirubin Urine: NEGATIVE
Glucose, UA: NEGATIVE mg/dL
Ketones, ur: NEGATIVE mg/dL
Leukocytes,Ua: NEGATIVE
Nitrite: NEGATIVE
Protein, ur: 30 mg/dL — AB
Specific Gravity, Urine: 1.025 (ref 1.005–1.030)
Urobilinogen, UA: 0.2 mg/dL (ref 0.0–1.0)
pH: 5.5 (ref 5.0–8.0)

## 2021-07-27 LAB — LIPASE, BLOOD: Lipase: 31 U/L (ref 11–51)

## 2021-07-27 MED ORDER — ONDANSETRON 4 MG PO TBDP: 4.0000 mg | ORAL_TABLET | Freq: Three times a day (TID) | ORAL | 0 refills | Status: DC | PRN

## 2021-07-27 MED ORDER — SODIUM CHLORIDE 0.9 % IV BOLUS
1000.0000 mL | Freq: Once | INTRAVENOUS | Status: AC
  Administered 2021-07-27: 1000 mL via INTRAVENOUS

## 2021-07-27 MED ORDER — ONDANSETRON 4 MG PO TBDP
4.0000 mg | ORAL_TABLET | Freq: Once | ORAL | Status: AC
  Administered 2021-07-27: 4 mg via ORAL

## 2021-07-27 MED ORDER — ONDANSETRON 4 MG PO TBDP
ORAL_TABLET | ORAL | Status: AC
  Filled 2021-07-27: qty 1

## 2021-07-27 NOTE — Discharge Instructions (Signed)
Your blood work showed elevated white blood cells.  This can be a sign of infection.  I would like you to have this repeated next week.  Use Zofran every 8 hours as needed for nausea and vomiting.  Make sure you are drinking plenty of fluid and eating a bland diet.  If at any point anything worsens and you have severe abdominal pain, nausea/vomiting interfere with oral intake, blood in your vomit, blood in your stool, weakness, passing out, chest pain, shortness of breath you need to go to the emergency room. ?

## 2021-07-27 NOTE — ED Notes (Signed)
Pt's IV fluids still infushing. Patient c/o being cold, pt given more warm blankets. Pt reports starting to feel a lot better.  ?

## 2021-07-27 NOTE — ED Triage Notes (Signed)
Pt reports constantly vomiting and diarrhea since 1 this morning. Pt states he has body aches.  ?

## 2021-07-27 NOTE — ED Provider Notes (Signed)
?[facility] ? ? ? ?CSN: [id number] ?Arrival date & time: 07/27/21  0803 ? ? ?  ? ?History   ?Chief Complaint ?Chief Complaint  ?Patient presents with  ? Emesis  ? Diarrhea  ? ? ?HPI ?Logan Tate is a 21 y.o. male.  ? ?Patient presents today with a several hour history of severe nausea, vomiting, diarrhea.  Reports symptoms began approximately 8 hours ago and have been persistent since that time.  He has vomited over 12 times and then developed dry heaving.  Reports feeling weak with generalized body aches.  Reports he had a URI symptoms last week but these have since resolved and denies any current fever, chest pain, shortness of breath, cough, nasal congestion.  He reports generalized abdominal pain that improves following bowel movement or vomiting.  He denies any hematemesis, melena, hematochezia.  Denies previous abdominal surgery and still has gallbladder and appendix.  Denies any recent medication changes.  Denies any recent antibiotic use.  He denies any recent travel, suspicious food intake, dietary changes.  Does report household sick contacts with similar symptoms.  He denies history of gastrointestinal disorder.  Reports he has been able to keep anything down for the past several hours prompting evaluation.  He does occasionally drink alcohol but denies any recent alcohol use.  Does not use GLP 1 agonist.  Occasionally smokes marijuana but denies any recent smoking prior to symptom onset. ? ? ?Past Medical History:  ?Diagnosis Date  ? Asthma   ? as a child  ? ? ?There are no problems to display for this patient. ? ? ?History reviewed. No pertinent surgical history. ? ? ? ? ?Home Medications   ? ?Prior to Admission medications   ?Medication Sig Start Date End Date Taking? Authorizing Provider  ?ondansetron (ZOFRAN-ODT) 4 MG disintegrating tablet Take 1 tablet (4 mg total) by mouth every 8 (eight) hours as needed for nausea or vomiting. 07/27/21  Yes Emerson Schreifels K, PA-C  ?ibuprofen (ADVIL) 800 MG  tablet Take 1 tablet (800 mg total) by mouth 3 (three) times daily. 12/08/20   Ivette LoyalSmith, Fowler R, NP  ?meloxicam (MOBIC) 15 MG tablet Take 1 tablet (15 mg total) by mouth daily. 01/28/21   Wallis BambergMani, Mario, PA-C  ?predniSONE (DELTASONE) 20 MG tablet Take 2 tablets (40 mg total) by mouth daily with breakfast. 01/21/21   Particia NearingLane, Rachel Elizabeth, PA-C  ? ? ?Family History ?Family History  ?Problem Relation Age of Onset  ? High Cholesterol Mother   ? Diabetes Mother   ? Hyperlipidemia Father   ? ? ?Social History ?Social History  ? ?Tobacco Use  ? Smoking status: Never  ? Smokeless tobacco: Never  ?Vaping Use  ? Vaping Use: Never used  ?Substance Use Topics  ? Alcohol use: Yes  ?  Comment: 1-2 x a mth.    ? Drug use: Yes  ?  Types: Marijuana  ?  Comment: 1-2 x a wk  ? ? ? ?Allergies   ?Patient has no known allergies. ? ? ?Review of Systems ?Review of Systems  ?Constitutional:  Positive for activity change. Negative for appetite change, fatigue and fever.  ?HENT:  Negative for congestion and sore throat.   ?Respiratory:  Negative for cough and shortness of breath.   ?Cardiovascular:  Negative for chest pain.  ?Gastrointestinal:  Positive for abdominal pain, diarrhea, nausea and vomiting.  ?Musculoskeletal:  Positive for arthralgias and myalgias.  ?Neurological:  Positive for weakness (generalized). Negative for dizziness, light-headedness, numbness and headaches.  ? ? ?  Physical Exam ?Triage Vital Signs ?ED Triage Vitals  ?Enc Vitals Group  ?   BP 07/27/21 0825 121/76  ?   Pulse Rate 07/27/21 0825 (!) 126  ?   Resp 07/27/21 0825 19  ?   Temp 07/27/21 0825 99.1 ?F (37.3 ?C)  ?   Temp Source 07/27/21 0825 Oral  ?   SpO2 07/27/21 0825 99 %  ?   Weight --   ?   Height --   ?   Head Circumference --   ?   Peak Flow --   ?   Pain Score 07/27/21 0824 6  ?   Pain Loc --   ?   Pain Edu? --   ?   Excl. in [place]? --   ? ?No data found. ? ?Updated Vital Signs ?BP 97/74 (BP Location: Left Arm)   Pulse (!) 112   Temp 99.1 ?F (37.3 ?C) (Oral)    Resp 15   SpO2 99%  ? ?Visual Acuity ?Right Eye Distance:   ?Left Eye Distance:   ?Bilateral Distance:   ? ?Right Eye Near:   ?Left Eye Near:    ?Bilateral Near:    ? ?Physical Exam ?Vitals reviewed.  ?Constitutional:   ?   General: He is awake.  ?   Appearance: Normal appearance. He is well-developed. He is ill-appearing.  ?   Comments: Very pleasant male appears stated age laying on exam room table in no acute distress  ?HENT:  ?   Head: Normocephalic and atraumatic.  ?   Mouth/Throat:  ?   Mouth: Mucous membranes are moist.  ?Cardiovascular:  ?   Rate and Rhythm: Regular rhythm. Tachycardia present.  ?   Heart sounds: Normal heart sounds, S1 normal and S2 normal. No murmur heard. ?Pulmonary:  ?   Effort: Pulmonary effort is normal.  ?   Breath sounds: Normal breath sounds. No stridor. No wheezing, rhonchi or rales.  ?   Comments: Clear to auscultation bilaterally ?Abdominal:  ?   General: Bowel sounds are normal.  ?   Palpations: Abdomen is soft.  ?   Tenderness: There is generalized abdominal tenderness. There is no right CVA tenderness, left CVA tenderness, guarding or rebound. Negative signs include Murphy's sign, Rovsing's sign, McBurney's sign and obturator sign.  ?   Comments: Mild tenderness palpation throughout abdomen.  No evidence of acute abdomen on physical exam.  ?Neurological:  ?   Mental Status: He is alert.  ?Psychiatric:     ?   Behavior: Behavior is cooperative.  ? ? ? ?UC Treatments / Results  ?Labs ?(all labs ordered are listed, but only abnormal results are displayed) ?Labs Reviewed  ?CBC WITH DIFFERENTIAL/PLATELET - Abnormal; Notable for the following components:  ?    Result Value  ? WBC 16.8 (*)   ? Platelets 448 (*)   ? Neutro Abs 15.4 (*)   ? Lymphs Abs 0.4 (*)   ? All other components within normal limits  ?COMPREHENSIVE METABOLIC PANEL - Abnormal; Notable for the following components:  ? Glucose, Bld 161 (*)   ? Total Protein 8.3 (*)   ? All other components within normal limits   ?POCT URINALYSIS DIPSTICK, ED / UC - Abnormal; Notable for the following components:  ? Hgb urine dipstick TRACE (*)   ? Protein, ur 30 (*)   ? All other components within normal limits  ?LIPASE, BLOOD  ? ? ?EKG ? ? ?Radiology ?No results found. ? ?Procedures ?Procedures (including critical  care time) ? ?Medications Ordered in UC ?Medications  ?ondansetron (ZOFRAN-ODT) disintegrating tablet 4 mg (4 mg Oral Given 07/27/21 0911)  ?sodium chloride 0.9 % bolus 1,000 mL (0 mLs Intravenous Stopped 07/27/21 1030)  ? ? ?Initial Impression / Assessment and Plan / UC Course  ?I have reviewed the triage vital signs and the nursing notes. ? ?Pertinent labs & imaging results that were available during my care of the patient were reviewed by me and considered in my medical decision making (see chart for details). ? ?  ? ?No indication for viral testing given clinical presentation and no URI symptoms.  Patient was noted to be tachycardic with soft blood pressure so was given bolus of fluid with improvement of symptoms.  He was given Zofran in clinic and able to pass oral challenge by drinking fluids as well as eating crackers.  He was sent home with prescription of Zofran with instruction to use this on a scheduled basis for the next several days then decrease use to as needed thereafter.  Recommended a bland diet and pushing fluids.  CBC showed leukocytosis without significant hemoconcentration; recommend to repeat blood work with PCP within a week.  CMP showed normal kidney function.  Lipase was negative.  Discussed that if he has any worsening symptoms including high fever, nausea/vomiting interfering with oral intake, focal or worsening abdominal pain, blood in stool, blood in vomit he needs to go to the emergency room.  Strict return precautions given to which she expressed understanding.  Work excuse note provided. ? ?Final Clinical Impressions(s) / UC Diagnoses  ? ?Final diagnoses:  ?Gastroenteritis  ?Nausea and vomiting,  unspecified vomiting type  ? ? ? ?Discharge Instructions   ? ?  ?Your blood work showed elevated white blood cells.  This can be a sign of infection.  I would like you to have this repeated next week.  Use Zofran e

## 2021-07-27 NOTE — ED Notes (Signed)
IV fluids completed at this time. Pt states that still unable to urinate at this time ? ?

## 2021-07-27 NOTE — ED Notes (Signed)
Pt ambulated with steady gait and without requiring any assistance to restroom to attempt getting urine specimen that is ordered.  ? ?Pt unable to get sample at this time.  ?

## 2021-09-01 ENCOUNTER — Ambulatory Visit (HOSPITAL_COMMUNITY)
Admission: EM | Admit: 2021-09-01 | Discharge: 2021-09-01 | Disposition: A | Discharge status: Home / Self Care | Payer: BC Managed Care – PPO | Attending: Internal Medicine | Admitting: Internal Medicine

## 2021-09-01 ENCOUNTER — Encounter (HOSPITAL_COMMUNITY): Payer: Self-pay | Admitting: Emergency Medicine

## 2021-09-01 DIAGNOSIS — S83401A Sprain of unspecified collateral ligament of right knee, initial encounter: Secondary | ICD-10-CM | POA: Diagnosis not present

## 2021-09-01 MED ORDER — IBUPROFEN 600 MG PO TABS: 600.0000 mg | ORAL_TABLET | Freq: Four times a day (QID) | ORAL | 0 refills | Status: AC | PRN

## 2021-09-01 NOTE — Discharge Instructions (Signed)
Please use your knee support you have at home ?Ibuprofen as needed for pain ?Gentle stretching exercises ?Continue icing your right knee.  Return to urgent care if you have worsening pain, knee swelling or inability to bear weight on the left leg. ?

## 2021-09-01 NOTE — ED Triage Notes (Signed)
Pt reports when playing basketball earlier today coming down from jumping his right knee twisted. Reports was swollen but since relieved after icing. Pain with weight bearing.  ?

## 2021-09-02 NOTE — ED Provider Notes (Signed)
?[facility] ? ? ? ?CSN: [id number] ?Arrival date & time: 09/01/21  1418 ? ? ?  ? ?History   ?Chief Complaint ?Chief Complaint  ?Patient presents with  ? Knee Pain  ? ? ?HPI ?Logan Tate is a 21 y.o. male comes to urgent care complaining of right knee pain which started after he twisted his knee while was playing basketball.  Patient endorses painful, right swollen knee.  He went home and iced the knee.  This helped with the swelling.  Pain is on the medial aspect of the right knee.  No buckling of the knee.  The knee does not give way.  No bruising on the right knee.  No radiation of pain.  No deformity.  ? ?HPI ? ?Past Medical History:  ?Diagnosis Date  ? Asthma   ? as a child  ? ? ?There are no problems to display for this patient. ? ? ?History reviewed. No pertinent surgical history. ? ? ? ? ?Home Medications   ? ?Prior to Admission medications   ?Medication Sig Start Date End Date Taking? Authorizing Provider  ?ibuprofen (ADVIL) 600 MG tablet Take 1 tablet (600 mg total) by mouth every 6 (six) hours as needed. 09/01/21  Yes Lanett Lasorsa, Myrene Galas, MD  ? ? ?Family History ?Family History  ?Problem Relation Age of Onset  ? High Cholesterol Mother   ? Diabetes Mother   ? Hyperlipidemia Father   ? ? ?Social History ?Social History  ? ?Tobacco Use  ? Smoking status: Never  ? Smokeless tobacco: Never  ?Vaping Use  ? Vaping Use: Never used  ?Substance Use Topics  ? Alcohol use: Yes  ?  Comment: 1-2 x a mth.    ? Drug use: Yes  ?  Types: Marijuana  ?  Comment: 1-2 x a wk  ? ? ? ?Allergies   ?Patient has no known allergies. ? ? ?Review of Systems ?Review of Systems ?As per HPI ? ?Physical Exam ?Triage Vital Signs ?ED Triage Vitals  ?Enc Vitals Group  ?   BP 09/01/21 1443 123/75  ?   Pulse Rate 09/01/21 1443 84  ?   Resp 09/01/21 1443 14  ?   Temp 09/01/21 1443 98.9 ?F (37.2 ?C)  ?   Temp Source 09/01/21 1443 Oral  ?   SpO2 09/01/21 1443 97 %  ?   Weight --   ?   Height --   ?   Head Circumference --   ?   Peak Flow  --   ?   Pain Score 09/01/21 1442 7  ?   Pain Loc --   ?   Pain Edu? --   ?   Excl. in [place]? --   ? ?No data found. ? ?Updated Vital Signs ?BP 123/75 (BP Location: Left Arm)   Pulse 84   Temp 98.9 ?F (37.2 ?C) (Oral)   Resp 14   SpO2 97%  ? ?Visual Acuity ?Right Eye Distance:   ?Left Eye Distance:   ?Bilateral Distance:   ? ?Right Eye Near:   ?Left Eye Near:    ?Bilateral Near:    ? ?Physical Exam ?Vitals and nursing note reviewed.  ?Constitutional:   ?   General: He is not in acute distress. ?   Appearance: Normal appearance. He is not ill-appearing.  ?Cardiovascular:  ?   Rate and Rhythm: Normal rate and regular rhythm.  ?Pulmonary:  ?   Effort: Pulmonary effort is normal.  ?   Breath sounds:  Normal breath sounds.  ?Musculoskeletal:     ?   General: Tenderness present. No swelling. Normal range of motion.  ?   Comments: Tenderness over the medial collateral ligament.  ?Skin: ?   General: Skin is warm.  ?Neurological:  ?   Mental Status: He is alert.  ? ? ? ?UC Treatments / Results  ?Labs ?(all labs ordered are listed, but only abnormal results are displayed) ?Labs Reviewed - No data to display ? ?EKG ? ? ?Radiology ?No results found. ? ?Procedures ?Procedures (including critical care time) ? ?Medications Ordered in UC ?Medications - No data to display ? ?Initial Impression / Assessment and Plan / UC Course  ?I have reviewed the triage vital signs and the nursing notes. ? ?Pertinent labs & imaging results that were available during my care of the patient were reviewed by me and considered in my medical decision making (see chart for details). ? ?  ? ?1.  Right knee sprain: ?Icing of the right knee ?Ibuprofen as needed for pain ?Continue knee brace for knee support ?Gentle stretching exercises ?No indication for imaging at this time ?Return to urgent care if symptoms worsen. ?Final Clinical Impressions(s) / UC Diagnoses  ? ?Final diagnoses:  ?Sprain of collateral ligament of right knee, initial encounter   ? ? ? ?Discharge Instructions   ? ?  ?Please use your knee support you have at home ?Ibuprofen as needed for pain ?Gentle stretching exercises ?Continue icing your right knee.  Return to urgent care if you have worsening pain, knee swelling or inability to bear weight on the left leg. ? ? ?ED Prescriptions   ? ? Medication Sig Dispense Auth. Provider  ? ibuprofen (ADVIL) 600 MG tablet Take 1 tablet (600 mg total) by mouth every 6 (six) hours as needed. 30 tablet Nillie Bartolotta, Myrene Galas, MD  ? ?  ? ?PDMP not reviewed this encounter. ?  ?Chase Picket, MD ?09/02/21 1556 ? ?

## 2021-10-08 ENCOUNTER — Ambulatory Visit
Admission: EM | Admit: 2021-10-08 | Discharge: 2021-10-08 | Disposition: A | Discharge status: Home / Self Care | Payer: BC Managed Care – PPO | Attending: Physician Assistant | Admitting: Physician Assistant

## 2021-10-08 DIAGNOSIS — R0789 Other chest pain: Secondary | ICD-10-CM

## 2021-10-08 DIAGNOSIS — J4521 Mild intermittent asthma with (acute) exacerbation: Secondary | ICD-10-CM

## 2021-10-08 MED ORDER — PREDNISONE 20 MG PO TABS: 20.0000 mg | ORAL_TABLET | Freq: Two times a day (BID) | ORAL | 0 refills | Status: AC

## 2021-10-08 NOTE — ED Triage Notes (Addendum)
Pt c/o chest pain that began this morning, the pain is centralized the pain radiates to his back. Patient describes the pain as a sharp feeling. The patient reports the associated symptoms are SOB, and the pain is constant. The patient states he does have asthma. The patient states he did do a neb treatment last night.

## 2021-10-08 NOTE — ED Provider Notes (Signed)
[facility]    CSN: [id number] Arrival date & time: 10/08/21  1208      History   Chief Complaint Chief Complaint  Patient presents with   Chest Pain   Shortness of Breath    HPI Gwyn Lehne is a 21 y.o. male.   He presents to the urgent care today by personal vehicle for complaints of chest pain and tightness and shortness of breath.  Patient states that he has a history of asthma that has come and gone since he was a child.  Patient states that he started to notice symptoms coming on last night similar to an asthma flare.  He has a nebulizer machine and was able to do an albuterol neb at home, which did help symptoms to a certain extent.  Patient states that he woke up this morning and felt like he needed to be seen.  He is requesting a work note for today.  He states that he is having chest tightness diffusely across the front of his chest.  It feels like a sharp pain.  He rates his pain as 7 out of 10.  It does not radiate to his back, neck, jaw.  It does not feel like a pressure pain.  He denies any nausea or vomiting.  No dizziness or headaches.  No fever chills or body aches. No cough.  Patient smokes marijuana regularly.  States that he did not smoke last night.  He denies any recent sick contacts or recent travel.  No other respiratory or cardiac history to note.    Past Medical History:  Diagnosis Date   Asthma    as a child    There are no problems to display for this patient.   History reviewed. No pertinent surgical history.     Home Medications    Prior to Admission medications   Medication Sig Start Date End Date Taking? Authorizing Provider  predniSONE (DELTASONE) 20 MG tablet Take 1 tablet (20 mg total) by mouth 2 (two) times daily with a meal for 5 days. 10/08/21 10/13/21 Yes Erastus Bartolomei M, PA-C  ibuprofen (ADVIL) 600 MG tablet Take 1 tablet (600 mg total) by mouth every 6 (six) hours as needed. 09/01/21   Lamptey, Myrene Galas, MD     Family History Family History  Problem Relation Age of Onset   High Cholesterol Mother    Diabetes Mother    Hyperlipidemia Father     Social History Social History   Tobacco Use   Smoking status: Never   Smokeless tobacco: Never  Vaping Use   Vaping Use: Never used  Substance Use Topics   Alcohol use: Yes    Comment: 1-2 x a mth.     Drug use: Yes    Types: Marijuana    Comment: 1-2 x a wk     Allergies   Patient has no known allergies.   Review of Systems Review of Systems  Respiratory:  Positive for shortness of breath.   Cardiovascular:  Positive for chest pain.  REFER TO HPI FOR PERTINENT POSITIVES AND NEGATIVES   Physical Exam Triage Vital Signs ED Triage Vitals  Enc Vitals Group     BP 10/08/21 1219 (!) 142/88     Pulse Rate 10/08/21 1219 90     Resp 10/08/21 1219 18     Temp 10/08/21 1219 98.3 F (36.8 C)     Temp Source 10/08/21 1219 Oral     SpO2 10/08/21 1219 95 %  Weight --      Height --      Head Circumference --      Peak Flow --      Pain Score 10/08/21 1218 6     Pain Loc --      Pain Edu? --      Excl. in [place]? --    No data found.  Updated Vital Signs BP (!) 142/88 (BP Location: Right Arm)   Pulse 90   Temp 98.3 F (36.8 C) (Oral)   Resp 18   SpO2 95%     Physical Exam Vitals and nursing note reviewed.  Constitutional:      General: He is not in acute distress.    Appearance: He is well-developed. He is not ill-appearing, toxic-appearing or diaphoretic.     Comments: Sitting comfortably  HENT:     Head: Normocephalic and atraumatic.  Eyes:     Conjunctiva/sclera: Conjunctivae normal.  Cardiovascular:     Rate and Rhythm: Normal rate and regular rhythm.     Heart sounds: No murmur heard. Pulmonary:     Effort: Pulmonary effort is normal. No tachypnea or respiratory distress.     Breath sounds: Normal breath sounds. No decreased breath sounds or wheezing.  Chest:     Comments: Complains of reproducible pain  across the chest with palpation  Musculoskeletal:        General: No swelling.     Cervical back: Neck supple.     Right lower leg: No edema.     Left lower leg: No edema.  Skin:    General: Skin is warm and dry.     Capillary Refill: Capillary refill takes less than 2 seconds.  Neurological:     Mental Status: He is alert.  Psychiatric:        Mood and Affect: Mood normal.     UC Treatments / Results  Labs (all labs ordered are listed, but only abnormal results are displayed) Labs Reviewed - No data to display  EKG   Radiology No results found.  Procedures Procedures (including critical care time)  Medications Ordered in UC Medications - No data to display  Initial Impression / Assessment and Plan / UC Course  I have reviewed the triage vital signs and the nursing notes.  Pertinent labs & imaging results that were available during my care of the patient were reviewed by me and considered in my medical decision making (see chart for details).     21 year old male found to be in no acute distress, no red flags.  Vital signs are stable.  He has a history of asthma and symptoms are consistent with a usual flareup for him.  No cardiac history or risk.  Did not feel like an EKG was necessary today.  He has albuterol nebs at home and a machine.  Did not need a refill of nebs at this time.  He is mostly concerned about getting a work note for today, and I obliged.  Rx for prednisone 20 mg twice daily x5 days to help with inflammation and to reduce asthma flare.  Encourage patient to stay hydrated.  He also needs to think about quitting marijuana use.  ER precautions advised. Final Clinical Impressions(s) / UC Diagnoses   Final diagnoses:  Mild intermittent asthma with acute exacerbation  Feeling of chest tightness     Discharge Instructions      Take prednisone as directed Be sure to stay hydrated Albuterol nebs every 4-6 hours  as needed  ER if acutely worse       ED Prescriptions     Medication Sig Dispense Auth. Provider   predniSONE (DELTASONE) 20 MG tablet Take 1 tablet (20 mg total) by mouth 2 (two) times daily with a meal for 5 days. 10 tablet Henry Demeritt, Randa Evens, PA-C      PDMP not reviewed this encounter.  Delorean Knutzen M. Davidlee Jeanbaptiste PA-C   Lavone Weisel, Randa Evens, PA-C 10/08/21 1251

## 2021-10-08 NOTE — Discharge Instructions (Signed)
Take prednisone as directed Be sure to stay hydrated Albuterol nebs every 4-6 hours as needed  ER if acutely worse

## 2021-12-17 ENCOUNTER — Ambulatory Visit (INDEPENDENT_AMBULATORY_CARE_PROVIDER_SITE_OTHER): Payer: BC Managed Care – PPO

## 2021-12-17 ENCOUNTER — Encounter: Payer: Self-pay | Admitting: Emergency Medicine

## 2021-12-17 ENCOUNTER — Ambulatory Visit
Admission: EM | Admit: 2021-12-17 | Discharge: 2021-12-17 | Disposition: A | Discharge status: Home / Self Care | Payer: BC Managed Care – PPO | Attending: Urgent Care | Admitting: Urgent Care

## 2021-12-17 DIAGNOSIS — M25561 Pain in right knee: Secondary | ICD-10-CM | POA: Diagnosis not present

## 2021-12-17 MED ORDER — NAPROXEN 500 MG PO TABS: 500.0000 mg | ORAL_TABLET | Freq: Two times a day (BID) | ORAL | 0 refills | Status: AC

## 2021-12-17 NOTE — ED Triage Notes (Signed)
Pt here with right knee pain since going to the gym last night and his child jumping on it while laying on the bed. Knee is swollen and painful today.

## 2021-12-17 NOTE — ED Provider Notes (Signed)
[facility]   MRN: [id number] DOB: [birth year]  Subjective:   Logan Tate is a 21 y.o. male presenting for 1 day history of acute onset right knee pain.  Symptoms started after he went to the gym yesterday and then as he was already hurting his child ended up jumping on his knee last night while he was laying in bed.  Woke up with it more painful and swollen today.  Pain is constant, sharp over the top of his knee, rated 10 out of 10.  No current facility-administered medications for this encounter.  Current Outpatient Medications:    ibuprofen (ADVIL) 600 MG tablet, Take 1 tablet (600 mg total) by mouth every 6 (six) hours as needed., Disp: 30 tablet, Rfl: 0   No Known Allergies  Past Medical History:  Diagnosis Date   Asthma    as a child     History reviewed. No pertinent surgical history.  Family History  Problem Relation Age of Onset   High Cholesterol Mother    Diabetes Mother    Hyperlipidemia Father     Social History   Tobacco Use   Smoking status: Never   Smokeless tobacco: Never  Vaping Use   Vaping Use: Never used  Substance Use Topics   Alcohol use: Yes    Comment: 1-2 x a mth.     Drug use: Yes    Types: Marijuana    Comment: 1-2 x a wk    ROS   Objective:   Vitals: BP 132/82   Pulse 84   Temp 98 F (36.7 C)   Resp 20   SpO2 98%   Physical Exam Constitutional:      General: He is not in acute distress.    Appearance: Normal appearance. He is well-developed and normal weight. He is not ill-appearing, toxic-appearing or diaphoretic.  HENT:     Head: Normocephalic and atraumatic.     Right Ear: External ear normal.     Left Ear: External ear normal.     Nose: Nose normal.     Mouth/Throat:     Pharynx: Oropharynx is clear.  Eyes:     General: No scleral icterus.       Right eye: No discharge.        Left eye: No discharge.     Extraocular Movements: Extraocular movements intact.  Cardiovascular:     Rate  and Rhythm: Normal rate.  Pulmonary:     Effort: Pulmonary effort is normal.  Musculoskeletal:     Cervical back: Normal range of motion.     Right knee: Swelling and bony tenderness present. No deformity, effusion, erythema, ecchymosis, lacerations or crepitus. Decreased range of motion. Tenderness (over area outlined) present. No medial joint line, lateral joint line or patellar tendon tenderness. Normal alignment and normal patellar mobility.       Legs:  Neurological:     Mental Status: He is alert and oriented to person, place, and time.  Psychiatric:        Mood and Affect: Mood normal.        Behavior: Behavior normal.        Thought Content: Thought content normal.        Judgment: Judgment normal.     DG Knee Complete 4 Views Right  Result Date: 12/17/2021 CLINICAL DATA:  Right knee pain and swelling. EXAM: RIGHT KNEE - COMPLETE 4+ VIEW COMPARISON:  Right knee radiographs 01/28/2021 FINDINGS: Image quality is degraded by  technique which limits assessment of some of the more superficial portions of the bones (including the lower pole of the patella and tibial tuberosity region on the lateral radiograph) and soft tissues. Within this limitation, no acute fracture, dislocation, or knee joint effusion is identified. Femorotibial joint space widths are preserved. IMPRESSION: No acute osseous abnormality identified. Electronically Signed   By: Sebastian Ache M.D.   On: 12/17/2021 11:04     Assessment and Plan :   PDMP not reviewed this encounter.  1. Acute pain of right knee    Patient likely strained his knee from his exercise and then injured it further his child jumped on his knee while he was in bed.  Recommended conservative management, RICE method.  An Ace wrap was applied to his right knee use naproxen for pain and inflammation.  Follow-up with [facility] if symptoms persist. Counseled patient on potential for adverse effects with medications prescribed/recommended  today, ER and return-to-clinic precautions discussed, patient verbalized understanding.    Wallis Bamberg, PA-C 12/17/21 1134

## 2022-01-29 ENCOUNTER — Ambulatory Visit
Admission: EM | Admit: 2022-01-29 | Discharge: 2022-01-29 | Disposition: A | Discharge status: Home / Self Care | Payer: BC Managed Care – PPO | Attending: Urgent Care | Admitting: Urgent Care

## 2022-01-29 ENCOUNTER — Encounter: Payer: Self-pay | Admitting: Emergency Medicine

## 2022-01-29 DIAGNOSIS — U071 COVID-19: Secondary | ICD-10-CM | POA: Diagnosis not present

## 2022-01-29 DIAGNOSIS — B349 Viral infection, unspecified: Secondary | ICD-10-CM | POA: Diagnosis present

## 2022-01-29 DIAGNOSIS — R509 Fever, unspecified: Secondary | ICD-10-CM

## 2022-01-29 LAB — SARS CORONAVIRUS 2 (TAT 6-24 HRS): SARS Coronavirus 2: POSITIVE — AB

## 2022-01-29 MED ORDER — ACETAMINOPHEN 325 MG PO TABS
650.0000 mg | ORAL_TABLET | Freq: Once | ORAL | Status: AC
  Administered 2022-01-29: 650 mg via ORAL

## 2022-01-29 NOTE — Discharge Instructions (Addendum)
We will notify you of your test results as they arrive and may take between about 24 hours.  I encourage you to sign up for MyChart if you have not already done so as this can be the easiest way for us to communicate results to you online or through a phone app.  Generally, we only contact you if it is a positive test result.  In the meantime, if you develop worsening symptoms including fever, chest pain, shortness of breath despite our current treatment plan then please report to the emergency room as this may be a sign of worsening status from possible viral infection.  Otherwise, we will manage this as a viral syndrome. For sore throat or cough try using a honey-based tea. Use 3 teaspoons of honey with juice squeezed from half lemon. Place shaved pieces of ginger into 1/2-1 cup of water and warm over stove top. Then mix the ingredients and repeat every 4 hours as needed. Please take Tylenol 500mg-650mg every 6 hours for aches and pains, fevers. Hydrate very well with at least 2 liters of water. Eat light meals such as soups to replenish electrolytes and soft fruits, veggies. Start an antihistamine like Zyrtec for postnasal drainage, sinus congestion.  You can take this together with pseudoephedrine (Sudafed) at a dose of 60 mg 2-3 times a day as needed for the same kind of congestion.   

## 2022-01-29 NOTE — ED Provider Notes (Signed)
[facility]  Note:  This document was prepared using Conservation officer, historic buildings and may include unintentional dictation errors.  MRN: [id number] DOB: 04-20-2001  Subjective:   Logan Tate is a 21 y.o. male presenting for 1 day history of acute onset mild headaches, body aches and pains, fever.  No runny or stuffy nose, sore throat, chest pain, shortness of breath or wheezing.  Patient is not opposed to COVID testing but thinks his symptoms are more related to having a very cold setting on his AC at night.  Has a history of asthma and has not needed his inhaler.  No current facility-administered medications for this encounter.  Current Outpatient Medications:    ibuprofen (ADVIL) 600 MG tablet, Take 1 tablet (600 mg total) by mouth every 6 (six) hours as needed., Disp: 30 tablet, Rfl: 0   naproxen (NAPROSYN) 500 MG tablet, Take 1 tablet (500 mg total) by mouth 2 (two) times daily with a meal., Disp: 30 tablet, Rfl: 0   No Known Allergies  Past Medical History:  Diagnosis Date   Asthma    as a child     History reviewed. No pertinent surgical history.  Family History  Problem Relation Age of Onset   High Cholesterol Mother    Diabetes Mother    Hyperlipidemia Father     Social History   Tobacco Use   Smoking status: Never   Smokeless tobacco: Never  Vaping Use   Vaping Use: Never used  Substance Use Topics   Alcohol use: Yes    Comment: 1-2 x a mth.     Drug use: Yes    Types: Marijuana    Comment: 1-2 x a wk    ROS   Objective:   Vitals: BP 132/73   Pulse (!) 120   Temp (!) 100.5 F (38.1 C)   Resp 18   SpO2 98%   Physical Exam Constitutional:      General: He is not in acute distress.    Appearance: Normal appearance. He is well-developed and normal weight. He is not ill-appearing, toxic-appearing or diaphoretic.  HENT:     Head: Normocephalic and atraumatic.     Right Ear: External ear normal.     Left Ear: External  ear normal.     Nose: Nose normal.     Mouth/Throat:     Mouth: Mucous membranes are moist.     Pharynx: No pharyngeal swelling, oropharyngeal exudate, posterior oropharyngeal erythema or uvula swelling.     Tonsils: No tonsillar exudate or tonsillar abscesses. 0 on the right. 0 on the left.  Eyes:     General: No scleral icterus.       Right eye: No discharge.        Left eye: No discharge.     Extraocular Movements: Extraocular movements intact.  Cardiovascular:     Rate and Rhythm: Normal rate and regular rhythm.     Heart sounds: Normal heart sounds. No murmur heard.    No friction rub. No gallop.  Pulmonary:     Effort: Pulmonary effort is normal. No respiratory distress.     Breath sounds: Normal breath sounds. No stridor. No wheezing, rhonchi or rales.  Musculoskeletal:     Cervical back: Normal range of motion.  Neurological:     Mental Status: He is alert and oriented to person, place, and time.     Cranial Nerves: No cranial nerve deficit.     Motor: No weakness.  Coordination: Coordination normal.     Gait: Gait normal.  Psychiatric:        Mood and Affect: Mood normal.        Behavior: Behavior normal.        Thought Content: Thought content normal.        Judgment: Judgment normal.     Assessment and Plan :   PDMP not reviewed this encounter.  1. Acute viral syndrome   2. Fever, unspecified     Will manage for viral illness such as viral URI, viral syndrome, viral rhinitis, COVID-19. Recommended supportive care. Offered scripts for symptomatic relief. Testing is pending. Counseled patient on potential for adverse effects with medications prescribed/recommended today, ER and return-to-clinic precautions discussed, patient verbalized understanding.     Jaynee Eagles, [place] 01/29/22 1111

## 2022-01-29 NOTE — ED Triage Notes (Signed)
Pt here with body aches, headache and fever since last night. Does not want covid test because he states he has no sxs.

## 2022-10-25 ENCOUNTER — Ambulatory Visit
Admit: 2022-10-25 | Discharge: 2022-10-25 | Disposition: A | Discharge status: Home / Self Care | Payer: BC Managed Care – PPO
